# Patient Record
Sex: Female | Born: 1985 | Race: Black or African American | Hispanic: No | Marital: Married | Smoking: Never smoker
Health system: Southern US, Community
[De-identification: ages and names within clinical notes are randomized; demographics above are authoritative.]

## PROBLEM LIST (undated history)

## (undated) ENCOUNTER — Inpatient Hospital Stay (HOSPITAL_COMMUNITY): Payer: Self-pay

## (undated) DIAGNOSIS — D649 Anemia, unspecified: Secondary | ICD-10-CM

## (undated) HISTORY — PX: NO PAST SURGERIES: SHX2092

---

## 2015-11-17 ENCOUNTER — Ambulatory Visit (INDEPENDENT_AMBULATORY_CARE_PROVIDER_SITE_OTHER): Payer: BC Managed Care – PPO | Admitting: Family Medicine

## 2015-11-17 VITALS — BP 118/70 | HR 76 | Temp 98.7°F | Resp 20 | Ht 65.0 in | Wt 151.2 lb

## 2015-11-17 DIAGNOSIS — R309 Painful micturition, unspecified: Secondary | ICD-10-CM

## 2015-11-17 LAB — POCT URINALYSIS DIP (MANUAL ENTRY)
Blood, UA: NEGATIVE
Glucose, UA: NEGATIVE
Nitrite, UA: NEGATIVE
Protein Ur, POC: 30 — AB
Spec Grav, UA: 1.025
Urobilinogen, UA: 1
pH, UA: 6

## 2015-11-17 LAB — POC MICROSCOPIC URINALYSIS (UMFC)

## 2015-11-17 MED ORDER — CIPROFLOXACIN HCL 500 MG PO TABS: 500.0000 mg | ORAL_TABLET | Freq: Two times a day (BID) | ORAL | Status: DC

## 2015-11-17 MED ORDER — PHENAZOPYRIDINE HCL 200 MG PO TABS: 200.0000 mg | ORAL_TABLET | Freq: Three times a day (TID) | ORAL | Status: DC | PRN

## 2015-11-17 NOTE — Progress Notes (Signed)
By signing my name below, I, Stann Ore, attest that this documentation has been prepared under the direction and in the presence of Elvina Sidle, MD. Electronically Signed: Stann Ore, Scribe. 11/17/2015 , 9:09 PM .  Patient was seen in room 11 .   Patient ID: Deborah Huff MRN: [id number], DOB: [birth year], 30 y.o. Date of Encounter: 11/17/2015  Primary Physician: No primary care provider on file.  Chief Complaint:  Chief Complaint  Patient presents with  . Urinary Tract Infection  . Dysuria  . Dyspareunia    HPI:  Deborah Huff is a 30 y.o. female who presents to [facility] complaining of UTI symptoms with dysuria and dyspareunia over the last 4 days. She notes that having UTI's in the past during college, last one being 5 years ago. Currently, she mentions that the severity lasts over 24 hours. She's in a monogamous relationship with her husband. She denies other sexual partners. She is not trying to become pregnant at this time.   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   History reviewed. No pertinent past medical history.   Home Meds: Prior to Admission medications   Medication Sig Start Date End Date Taking? Authorizing Provider  norethindrone-ethinyl estradiol (MICROGESTIN FE 1/20) 1-20 MG-MCG tablet Take 1 tablet by mouth daily.   Yes Historical Provider, MD    Allergies: No Known Allergies  Social History   Social History  . Marital Status: Married    Spouse Name: N/A  . Number of Children: N/A  . Years of Education: N/A   Occupational History  . Not on file.   Social History Main Topics  . Smoking status: Never Smoker   . Smokeless tobacco: Not on file  . Alcohol Use: 0.6 oz/week    1 Standard drinks or equivalent per week  . Drug Use: No  . Sexual Activity: Not on file   Other Topics Concern  . Not on file     Social History Narrative  . No narrative on file     Review of Systems: Constitutional: negative for fever, chills, night sweats, weight changes, or fatigue  HEENT: negative for vision changes, hearing loss, congestion, rhinorrhea, ST, epistaxis, or sinus pressure Cardiovascular: negative for chest pain or palpitations Respiratory: negative for hemoptysis, wheezing, shortness of breath, or cough Abdominal: negative for abdominal pain, nausea, vomiting, diarrhea, or constipation Dermatological: negative for rash Neurologic: negative for headache, dizziness, or syncope GU: positive for dysuria, dyspareunia All other systems reviewed and are otherwise negative with the exception to those above and in the HPI.  Physical Exam: Blood pressure 118/70, pulse 76, temperature 98.7 F (37.1 C), temperature source Oral, resp. rate 20, height  (1.651 m), weight 151 lb 3.2 oz (68.584 kg), last menstrual period 10/27/2015, SpO2 90 %., Body mass index is 25.16 kg/(m^2). General: Well developed, well nourished, in no acute distress. Head: Normocephalic, atraumatic, eyes without discharge, sclera non-icteric, nares are without discharge. Bilateral auditory canals clear, TM's are without perforation, pearly grey and translucent with reflective cone of light bilaterally. Oral cavity moist, posterior pharynx without exudate, erythema, peritonsillar abscess, or post nasal drip.  Neck: Supple. No thyromegaly. Full ROM. No lymphadenopathy. Lungs: Clear bilaterally to auscultation without wheezes, rales, or rhonchi. Breathing is unlabored. Heart: RRR with S1 S2. No murmurs, rubs, or gallops appreciated. Abdomen: Soft, non-tender, non-distended with normoactive bowel sounds. No hepatomegaly. No rebound/guarding. No  obvious abdominal masses. Msk:  Strength and tone normal for age. Extremities/Skin: Warm and dry. No clubbing or cyanosis. No edema. No rashes or suspicious lesions. Neuro: Alert and oriented X  3. Moves all extremities spontaneously. Gait is normal. CNII-XII grossly in tact. Psych:  Responds to questions appropriately with a normal affect.   Labs: Results for orders placed or performed in visit on 11/17/15  POCT Microscopic Urinalysis ([facility])  Result Value Ref Range   WBC,UR,HPF,POC Moderate (A) None WBC/hpf   RBC,UR,HPF,POC Few (A) None RBC/hpf   Bacteria Few (A) None, Too numerous to count   Mucus Present (A) Absent   Epithelial Cells, UR Per Microscopy Moderate (A) None, Too numerous to count cells/hpf  POCT urinalysis dipstick  Result Value Ref Range   Color, UA yellow yellow   Clarity, UA cloudy (A) clear   Glucose, UA negative negative   Bilirubin, UA small (A) negative   Ketones, POC UA small (15) (A) negative   Spec Grav, UA 1.025    Blood, UA negative negative   pH, UA 6.0    Protein Ur, POC =30 (A) negative   Urobilinogen, UA 1.0    Nitrite, UA Negative Negative   Leukocytes, UA Trace (A) Negative    ASSESSMENT AND PLAN:  30 y.o. year old female with UTI This chart was scribed in my presence and reviewed by me personally.    ICD-9-CM ICD-10-CM   1. Pain with urination 788.1 R30.9 POCT Microscopic Urinalysis ([facility])     POCT urinalysis dipstick     ciprofloxacin (CIPRO) 500 MG tablet     phenazopyridine (PYRIDIUM) 200 MG tablet    Signed, Elvina Sidle, MD 11/17/2015 9:09 PM

## 2015-11-17 NOTE — Patient Instructions (Signed)

## 2015-11-22 ENCOUNTER — Other Ambulatory Visit: Payer: Self-pay | Admitting: Family Medicine

## 2015-11-22 NOTE — Telephone Encounter (Signed)
Did Cipro work?

## 2016-01-03 ENCOUNTER — Emergency Department (HOSPITAL_COMMUNITY)
Admission: EM | Admit: 2016-01-03 | Discharge: 2016-01-03 | Disposition: A | Discharge status: Home / Self Care | Payer: BC Managed Care – PPO | Source: Home / Self Care | Attending: Family Medicine | Admitting: Family Medicine

## 2016-01-03 ENCOUNTER — Encounter (HOSPITAL_COMMUNITY): Payer: Self-pay | Admitting: Emergency Medicine

## 2016-01-03 ENCOUNTER — Emergency Department (INDEPENDENT_AMBULATORY_CARE_PROVIDER_SITE_OTHER): Payer: BC Managed Care – PPO

## 2016-01-03 DIAGNOSIS — S93401A Sprain of unspecified ligament of right ankle, initial encounter: Secondary | ICD-10-CM

## 2016-01-03 MED ORDER — IBUPROFEN 400 MG PO TABS: 400.0000 mg | ORAL_TABLET | Freq: Four times a day (QID) | ORAL | Status: DC | PRN

## 2016-01-03 NOTE — ED Provider Notes (Signed)
CSN: [id number]     Arrival date & time 01/03/16  1937 History   First MD Initiated Contact with Patient 01/03/16 2051     Chief Complaint  Patient presents with  . Ankle Pain   (Consider location/radiation/quality/duration/timing/severity/associated sxs/prior Treatment) Patient is a 30 y.o. female presenting with ankle pain. The history is provided by the patient.  Ankle Pain Location:  Ankle and knee Time since incident:  1 day Injury: yes   Mechanism of injury: fall   Fall:    Fall occurred:  Recreating/playing (playground jungle gym; rolled ankle; landed with knee/ankle trapped under buttocks)   Height of fall:  Standing   Impact surface:  Ambulance personlayground equipment (jungle gym)   Point of impact:  Knees and feet   Entrapped after fall: no   Knee location:  R knee Ankle location:  R ankle Pain details:    Quality:  Throbbing and tingling   Radiates to:  Does not radiate   Severity:  Moderate   Onset quality:  Sudden   Duration:  1 day   Timing:  Constant   Progression:  Worsening Chronicity:  New Dislocation: no   Foreign body present:  No foreign bodies Prior injury to area:  No Relieved by:  Elevation and ice Worsened by:  Activity and bearing weight Ineffective treatments:  Immobilization (ankle brace) Associated symptoms: numbness (intermittent), stiffness and tingling (intermittent)   Associated symptoms: no muscle weakness   Risk factors: no frequent fractures     History reviewed. No pertinent past medical history. History reviewed. No pertinent past surgical history. Family History  Problem Relation Age of Onset  . Hypertension Mother   . Hypertension Father   . Cancer Maternal Grandmother   . Heart disease Maternal Grandmother   . Diabetes Maternal Grandmother   . Cancer Maternal Grandfather   . Heart disease Maternal Grandfather    Social History  Substance Use Topics  . Smoking status: Never Smoker   . Smokeless tobacco: None  . Alcohol Use: 0.6  oz/week    1 Standard drinks or equivalent per week   OB History    No data available     Review of Systems  Respiratory: Negative.   Cardiovascular: Negative.   Gastrointestinal: Negative.   Musculoskeletal: Positive for joint swelling, arthralgias and stiffness.  All other systems reviewed and are negative.   Allergies  Review of patient's allergies indicates no known allergies.  Home Medications   Prior to Admission medications   Medication Sig Start Date End Date Taking? Authorizing Provider  ciprofloxacin (CIPRO) 500 MG tablet Take 1 tablet (500 mg total) by mouth 2 (two) times daily. 11/17/15   Elvina SidleKurt Lauenstein, MD  norethindrone-ethinyl estradiol (MICROGESTIN FE 1/20) 1-20 MG-MCG tablet Take 1 tablet by mouth daily.    Historical Provider, MD  phenazopyridine (PYRIDIUM) 200 MG tablet Take 1 tablet (200 mg total) by mouth 3 (three) times daily as needed for pain. 11/17/15   Elvina SidleKurt Lauenstein, MD   Meds Ordered and Administered this Visit  Medications - No data to display  BP 118/55 mmHg  Pulse 77  Temp(Src) 98.4 F (36.9 C) (Oral)  SpO2 99%  LMP 12/23/2015 No data found.   Physical Exam  Constitutional: She appears well-developed. No distress.  Cardiovascular: Normal rate, regular rhythm and normal heart sounds.   No murmur heard. Pulmonary/Chest: Effort normal and breath sounds normal. No respiratory distress. She has no wheezes.  Abdominal: Soft. Bowel sounds are normal. She exhibits no distension. There is  no tenderness.  Musculoskeletal:       Right knee: Normal.       Left knee: Normal.       Right ankle: She exhibits decreased range of motion and swelling. She exhibits no ecchymosis and no laceration. Tenderness.       Left ankle: Normal.  Nursing note and vitals reviewed.   ED Course  Procedures (including critical care time)  Labs Review Labs Reviewed - No data to display  Imaging Review No results found.   Visual Acuity Review  Right Eye  Distance:   Left Eye Distance:   Bilateral Distance:    Right Eye Near:   Left Eye Near:    Bilateral Near:      Dg Ankle Complete Right  01/03/2016  CLINICAL DATA:  Missed a step and twisted ankle. Pain. Initial encounter. EXAM: RIGHT ANKLE - COMPLETE 3+ VIEW COMPARISON:  None. FINDINGS: No evidence of fracture. No subluxation or dislocation. Ankle mortise is preserved. Lateral soft tissue swelling is evident. IMPRESSION: Negative. Electronically Signed   By: Kennith Center M.D.   On: 01/03/2016 21:12      MDM  No diagnosis found. Right ankle sprain, initial encounter  Patient reassured her xray of the ankle was negative. I recommended NSAID. Ibuprofen 400 mg prn pain prescribed. Ankle brace recommended. F/U as needed.    Doreene Eland, MD 01/03/16 2135

## 2016-01-03 NOTE — Discharge Instructions (Signed)
Ankle Sprain °An ankle sprain is an injury to the strong, fibrous tissues (ligaments) that hold your ankle bones together.  °HOME CARE  °· Put ice on your ankle for 1-2 days or as told by your doctor. °¨ Put ice in a plastic bag. °¨ Place a towel between your skin and the bag. °¨ Leave the ice on for 15-20 minutes at a time, every 2 hours while you are awake. °· Only take medicine as told by your doctor. °· Raise (elevate) your injured ankle above the level of your heart as much as possible for 2-3 days. °· Use crutches if your doctor tells you to. Slowly put your own weight on the affected ankle. Use the crutches until you can walk without pain. °· If you have a plaster splint: °¨ Do not rest it on anything harder than a pillow for 24 hours. °¨ Do not put weight on it. °¨ Do not get it wet. °¨ Take it off to shower or bathe. °· If given, use an elastic wrap or support stocking for support. Take the wrap off if your toes lose feeling (numb), tingle, or turn cold or blue. °· If you have an air splint: °¨ Add or let out air to make it comfortable. °¨ Take it off at night and to shower and bathe. °¨ Wiggle your toes and move your ankle up and down often while you are wearing it. °GET HELP IF: °· You have rapidly increasing bruising or puffiness (swelling). °· Your toes feel very cold. °· You lose feeling in your foot. °· Your medicine does not help your pain. °GET HELP RIGHT AWAY IF:  °· Your toes lose feeling (numb) or turn blue. °· You have severe pain that is increasing. °MAKE SURE YOU:  °· Understand these instructions. °· Will watch your condition. °· Will get help right away if you are not doing well or get worse. °  °This information is not intended to replace advice given to you by your health care provider. Make sure you discuss any questions you have with your health care provider. °  °Document Released: 03/28/2008 Document Revised: 10/31/2014 Document Reviewed: 04/23/2012 °Elsevier Interactive Patient  Education ©2016 Elsevier Inc. ° °

## 2016-01-03 NOTE — ED Notes (Signed)
Pt here with right ankle lateral swelling and pain after [place] step [place] the park yesterday Ace wrap, compression applied, Ibuprofen

## 2016-04-06 ENCOUNTER — Ambulatory Visit (INDEPENDENT_AMBULATORY_CARE_PROVIDER_SITE_OTHER): Payer: BC Managed Care – PPO | Admitting: Family Medicine

## 2016-04-06 VITALS — BP 122/78 | HR 64 | Temp 97.9°F | Resp 16 | Wt 163.0 lb

## 2016-04-06 DIAGNOSIS — G609 Hereditary and idiopathic neuropathy, unspecified: Secondary | ICD-10-CM

## 2016-04-06 LAB — TSH: TSH: 0.41 mIU/L

## 2016-04-06 LAB — VITAMIN B12: Vitamin B-12: 289 pg/mL (ref 200–1100)

## 2016-04-06 NOTE — Progress Notes (Signed)
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 When she was there, she walked on hot sand initially handled some hot dishes. She developed numbness in her feet and in the tips of her fingers on her ulnar 3 fingers.  The numbness on her feet has resolved and the numbness in her fingers is getting better. She is concerned that she might have diabetes because this runs in family.  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   Objective: BP 122/78 mmHg  Pulse 64  Temp(Src) 97.9 F (36.6 C) (Oral)  Resp 16  Wt 163 lb (73.936 kg)  SpO2 96%  LMP 04/05/2016 General appearance: Healthy-appearing young adult woman in no distress Neurological exam: Normal strength in 4 extremities, normal reflexes in biceps, triceps, ankles, and knees. Cranial nerves III through XII: Intact Motor exam shows normal gait and normal strength: Intact. Tinel's and Phalen's signs are negative Skin exam: No rash Pulses, normal Neck: Normal range of motion   Assessment: Gradually improving numbness in fingertips on the ulnar size of her fingers, resolving numbness on her feet. I do not believe that these represent a significant demyelinating process.  Plan: Hereditary and idiopathic peripheral neuropathy - Plan: BASIC METABOLIC PANEL WITH GFR, TSH, Vitamin B12  . Elvina SidleKurt Eziah Negro M.D.

## 2016-04-06 NOTE — Patient Instructions (Addendum)
I believe that you do have a mild neuropathy which should resolve over the next week or so. Please call if the symptoms are worsening or if they fail to resolve in a week. One simple strategies to speed things up would be to take vitamin B6 (paradoxical) 50 mg 3 times a day for week.  Environmental link to [email or web address]    IF you received an x-ray today, you will [facility]. Please contact [facility] at [phone] with questions or concerns regarding your invoice.   IF you received labwork today, you will receive an invoice from [facility]. Please contact [facility] at [phone] with questions or concerns regarding your invoice.   Our billing staff will not be able to assist you with questions regarding bills from these companies.  You will be contacted with the lab results as soon as they are available. The fastest way to get your results is to activate your My Chart account. Instructions are located on the last page of this paperwork. If you have not heard from us regarding the results in 2 weeks, please contact this office.

## 2016-04-07 ENCOUNTER — Telehealth: Payer: Self-pay | Admitting: Emergency Medicine

## 2016-04-07 LAB — BASIC METABOLIC PANEL WITH GFR
BUN: 12 mg/dL (ref 7–25)
CO2: 19 mmol/L — ABNORMAL LOW (ref 20–31)
Calcium: 9.3 mg/dL (ref 8.6–10.2)
Chloride: 105 mmol/L (ref 98–110)
Creat: 0.88 mg/dL (ref 0.50–1.10)
GFR, Est African American: >89 mL/min (ref >=60)
GFR, Est Non African American: >89 mL/min (ref >=60)
Glucose, Bld: 82 mg/dL (ref 65–99)
Potassium: 4.2 mmol/L (ref 3.5–5.3)
Sodium: 134 mmol/L — ABNORMAL LOW (ref 135–146)

## 2016-04-07 NOTE — Telephone Encounter (Signed)
Pt given normal blood results 

## 2016-04-07 NOTE — Telephone Encounter (Signed)
-----   Message from Elvina SidleKurt Lauenstein, MD sent at 04/07/2016  8:35 AM EDT ----- Please inform patient of normal result

## 2016-06-21 IMAGING — DX DG ANKLE COMPLETE 3+V*R*
3 series · 3 of 3 positions shown · non-contrast
Comparison: None.

CLINICAL DATA: Missed a step and twisted ankle. Pain. Initial
encounter.

EXAM:
RIGHT ANKLE - COMPLETE 3+ VIEW

[ankle ap]
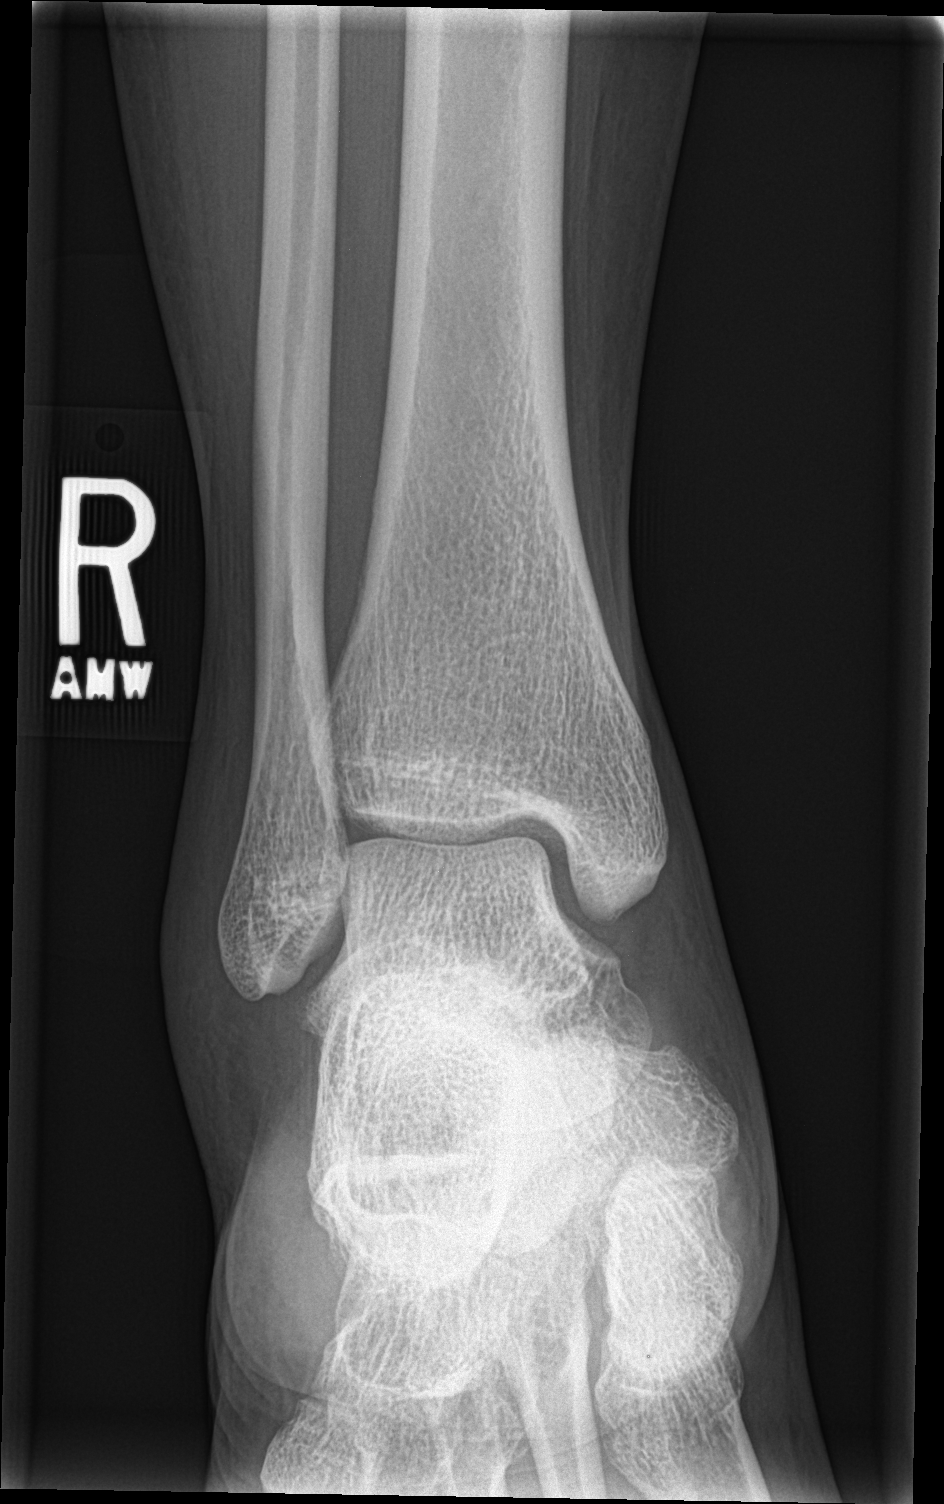

[ankle obl]
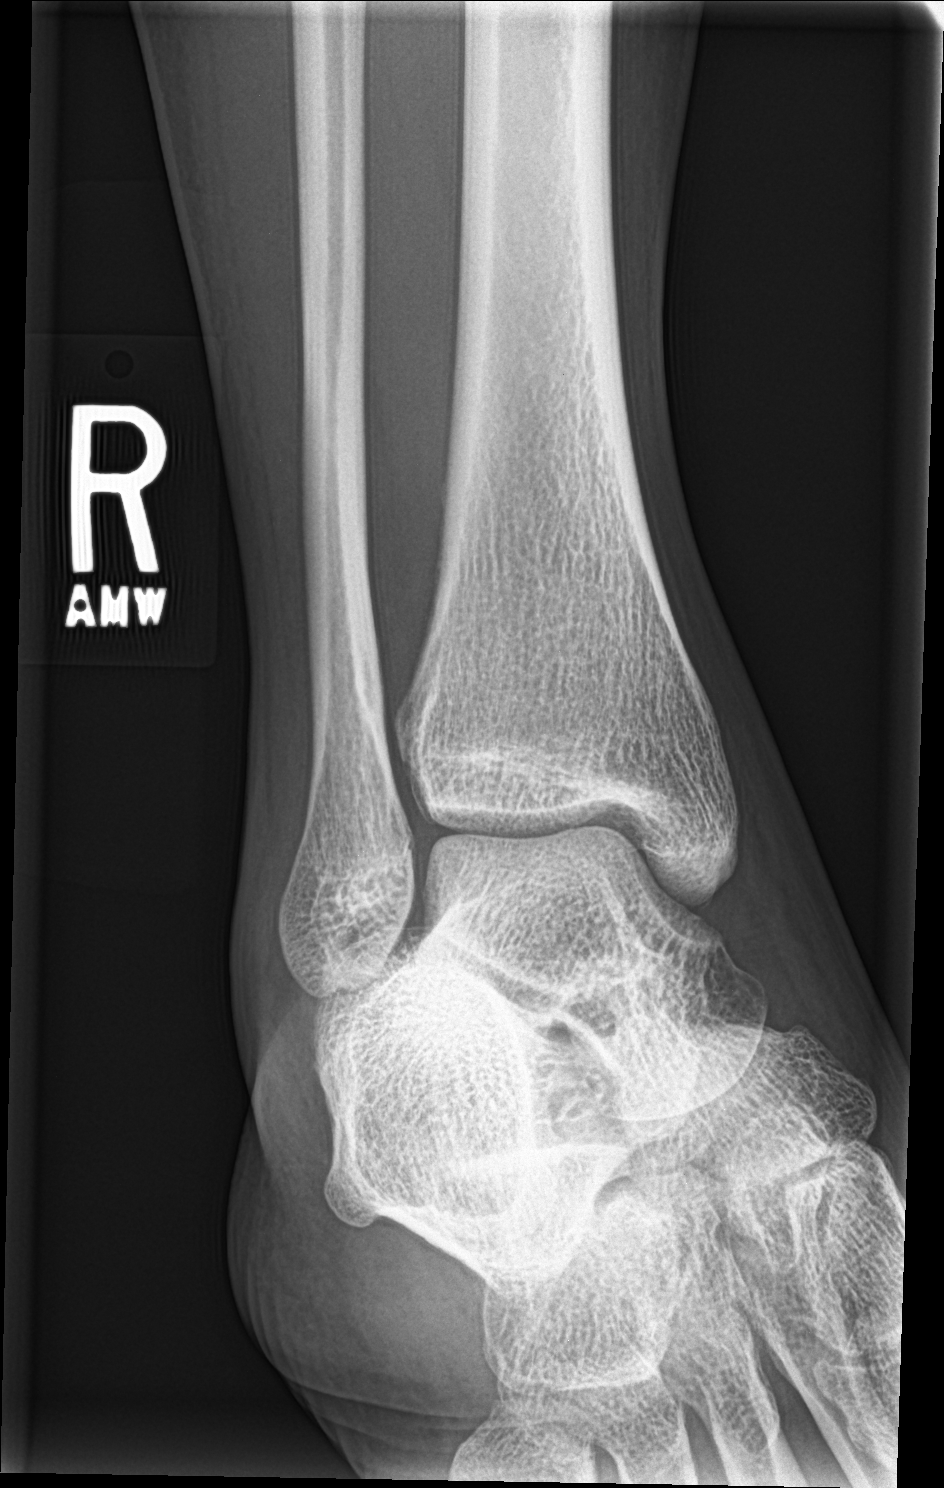

[ankle lat]
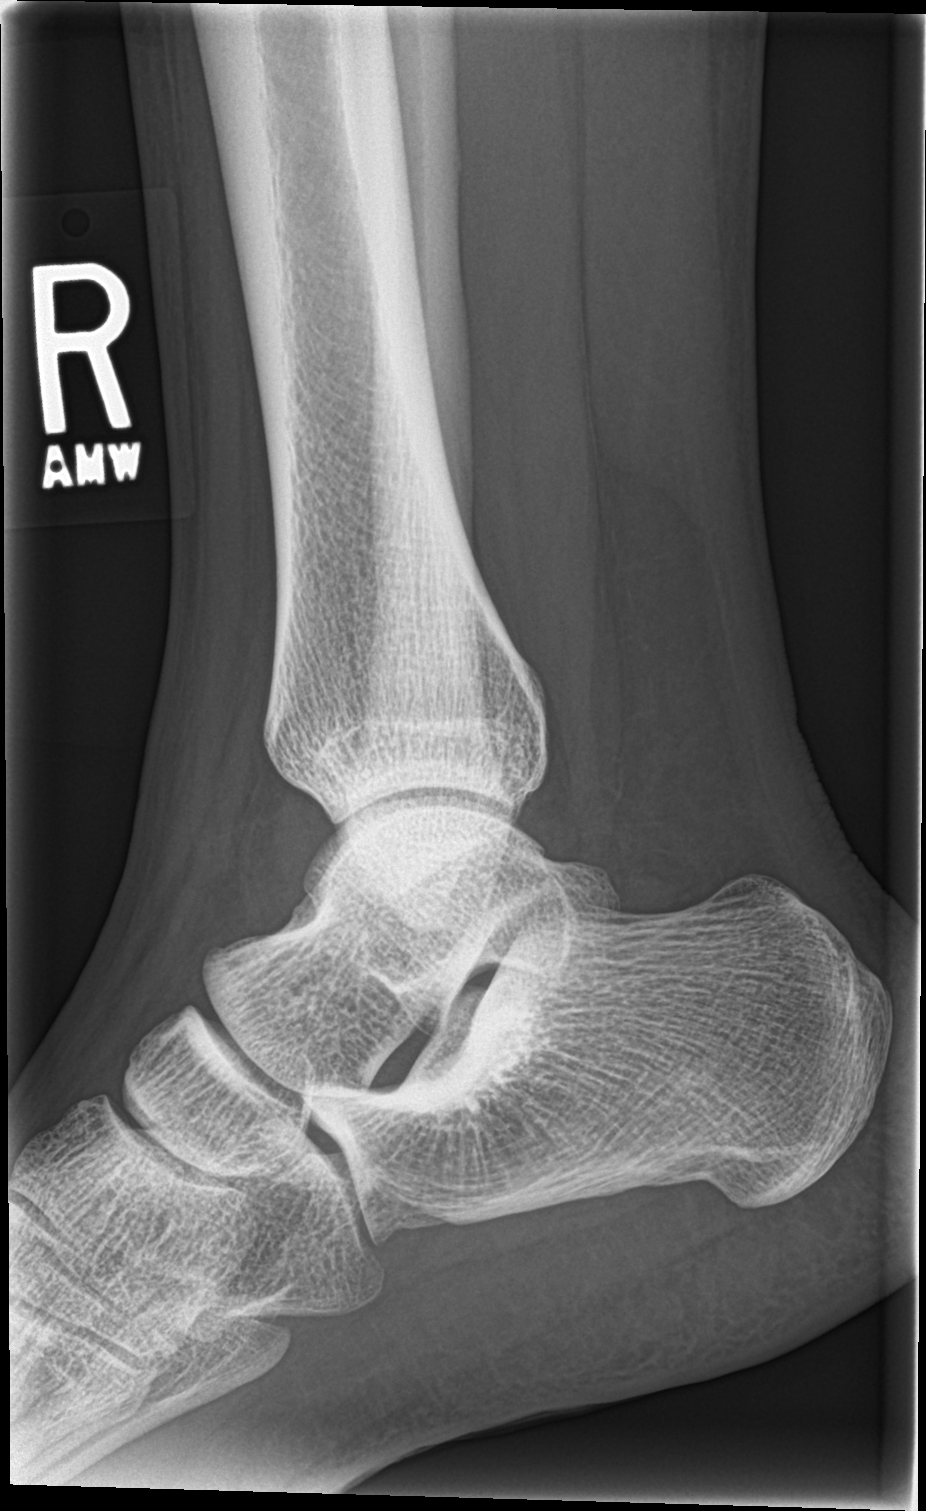

[3 of 3 positions shown; findings below may reference images not displayed]

FINDINGS: No evidence of fracture. No subluxation or dislocation. Ankle
mortise is preserved. Lateral soft tissue swelling is evident.
IMPRESSION: Negative.

## 2017-05-04 LAB — OB RESULTS CONSOLE GC/CHLAMYDIA
Chlamydia: NEGATIVE
Gonorrhea: NEGATIVE

## 2017-05-09 LAB — OB RESULTS CONSOLE HEPATITIS B SURFACE ANTIGEN: Hepatitis B Surface Ag: NEGATIVE

## 2017-05-09 LAB — OB RESULTS CONSOLE HIV ANTIBODY (ROUTINE TESTING): HIV: NONREACTIVE

## 2017-05-09 LAB — OB RESULTS CONSOLE ABO/RH: RH Type: POSITIVE

## 2017-05-09 LAB — OB RESULTS CONSOLE ANTIBODY SCREEN: Antibody Screen: NEGATIVE

## 2017-05-09 LAB — OB RESULTS CONSOLE RUBELLA ANTIBODY, IGM: Rubella: IMMUNE

## 2017-05-09 LAB — OB RESULTS CONSOLE RPR: RPR: NONREACTIVE

## 2017-08-31 ENCOUNTER — Other Ambulatory Visit: Payer: Self-pay

## 2017-08-31 ENCOUNTER — Encounter (HOSPITAL_COMMUNITY): Payer: Self-pay

## 2017-08-31 ENCOUNTER — Inpatient Hospital Stay (HOSPITAL_COMMUNITY)
Admission: AD | Admit: 2017-08-31 | Discharge: 2017-08-31 | Disposition: A | Discharge status: Home / Self Care | Payer: BC Managed Care – PPO | Source: Ambulatory Visit | Attending: Obstetrics and Gynecology | Admitting: Obstetrics and Gynecology

## 2017-08-31 DIAGNOSIS — O26892 Other specified pregnancy related conditions, second trimester: Secondary | ICD-10-CM | POA: Diagnosis not present

## 2017-08-31 DIAGNOSIS — O99612 Diseases of the digestive system complicating pregnancy, second trimester: Secondary | ICD-10-CM

## 2017-08-31 DIAGNOSIS — O99619 Diseases of the digestive system complicating pregnancy, unspecified trimester: Secondary | ICD-10-CM

## 2017-08-31 DIAGNOSIS — J988 Other specified respiratory disorders: Secondary | ICD-10-CM

## 2017-08-31 DIAGNOSIS — Z3A26 26 weeks gestation of pregnancy: Secondary | ICD-10-CM | POA: Diagnosis not present

## 2017-08-31 DIAGNOSIS — J309 Allergic rhinitis, unspecified: Secondary | ICD-10-CM | POA: Diagnosis not present

## 2017-08-31 DIAGNOSIS — K219 Gastro-esophageal reflux disease without esophagitis: Secondary | ICD-10-CM

## 2017-08-31 DIAGNOSIS — R0603 Acute respiratory distress: Secondary | ICD-10-CM | POA: Diagnosis present

## 2017-08-31 DIAGNOSIS — R079 Chest pain, unspecified: Secondary | ICD-10-CM | POA: Insufficient documentation

## 2017-08-31 DIAGNOSIS — J398 Other specified diseases of upper respiratory tract: Secondary | ICD-10-CM

## 2017-08-31 HISTORY — DX: Anemia, unspecified: D64.9

## 2017-08-31 LAB — URINALYSIS, ROUTINE W REFLEX MICROSCOPIC
Bacteria, UA: NONE SEEN
Bilirubin Urine: NEGATIVE
Glucose, UA: NEGATIVE mg/dL
Hgb urine dipstick: NEGATIVE
Ketones, ur: NEGATIVE mg/dL
Nitrite: NEGATIVE
Protein, ur: NEGATIVE mg/dL
Specific Gravity, Urine: 1.002 — ABNORMAL LOW (ref 1.005–1.030)
pH: 7 (ref 5.0–8.0)

## 2017-08-31 MED ORDER — CETIRIZINE HCL 10 MG PO TABS: 10.0000 mg | ORAL_TABLET | Freq: Every day | ORAL | 1 refills | Status: AC

## 2017-08-31 MED ORDER — RANITIDINE HCL 150 MG PO TABS: 150.0000 mg | ORAL_TABLET | Freq: Two times a day (BID) | ORAL | Status: DC

## 2017-08-31 MED ORDER — GI COCKTAIL ~~LOC~~
30.0000 mL | Freq: Once | ORAL | Status: AC
  Administered 2017-08-31: 30 mL via ORAL
  Filled 2017-08-31: qty 30

## 2017-08-31 NOTE — Discharge Instructions (Signed)
Heartburn During Pregnancy Heartburn is pain or discomfort in the throat or chest. It may cause a burning feeling. It happens when stomach acid moves up into the tube that carries food from your mouth to your stomach (esophagus). Heartburn is common during pregnancy. It usually goes away or gets better after giving birth. Follow these instructions at home: Eating and drinking  Do not drink alcohol while you are pregnant.  Figure out which foods and beverages make you feel worse, and avoid them.  Beverages that you may want to avoid include: ? Coffee and tea (with or without caffeine). ? Energy drinks and sports drinks. ? Bubbly (carbonated) drinks or sodas. ? Citrus fruit juices.  Foods that you may want to avoid include: ? Chocolate and cocoa. ? Peppermint and mint flavorings. ? Garlic, onions, and horseradish. ? Spicy and acidic foods. These include peppers, chili powder, curry powder, vinegar, hot sauces, and barbecue sauce. ? Citrus fruits, such as oranges, lemons, and limes. ? Tomato-based foods, such as red sauce, chili, and salsa. ? Fried and fatty foods, such as donuts, french fries, potato chips, and high-fat dressings. ? High-fat meats, such as hot dogs, cold cuts, sausage, ham, and bacon. ? High-fat dairy items, such as whole milk, butter, and cheese.  Eat small meals often, instead of large meals.  Avoid drinking a lot of liquid with your meals.  Avoid eating meals during the 2-3 hours before you go to bed.  Avoid lying down right after you eat.  Do not exercise right after you eat. Medicines  Take over-the-counter and prescription medicines only as told by your doctor.  Do not take aspirin, ibuprofen, or other NSAIDs unless your doctor tells you to do that.  Your doctor may tell you to avoid medicines that have sodium bicarbonate in them. General instructions  If told, raise the head of your bed about 6 inches (15 cm). You can do this by putting blocks under  the legs. Sleeping with more pillows does not help with heartburn.  Do not use any products that contain nicotine or tobacco, such as cigarettes and e-cigarettes. If you need help quitting, ask your doctor.  Wear loose-fitting clothing.  Try to lower your stress, such as with yoga or meditation. If you need help, ask your doctor.  Stay at a healthy weight. If you are overweight, work with your doctor to safely lose weight.  Keep all follow-up visits as told by your doctor. This is important. Contact a doctor if:  You get new symptoms.  Your symptoms do not get better with treatment.  You have weight loss and you do not know why.  You have trouble swallowing.  You make loud sounds when you breathe (wheeze).  You have a cough that does not go away.  You have heartburn often for more than 2 weeks.  You feel sick to your stomach (nauseous), and this does not get better with treatment.  You are throwing up (vomiting), and this does not get better with treatment.  You have pain in your belly (abdomen). Get help right away if:  You have very bad chest pain that spreads to your arm, neck, or jaw.  You feel sweaty, dizzy, or light-headed.  You have trouble breathing.  You have pain when swallowing.  You throw up and your throw-up looks like blood or coffee grounds.  Your poop (stool) is bloody or black. This information is not intended to replace advice given to you by your health care provider.   Make sure you discuss any questions you have with your health care provider. Document Released: 11/12/2010 Document Revised: 06/27/2016 Document Reviewed: 06/27/2016 Elsevier Interactive Patient Education  2017 Elsevier Inc.  

## 2017-08-31 NOTE — MAU Note (Signed)
About 1 am, woke up, felt like couldn't breathe, tried to clear throat, blow nose, started having chest and back pain and then difficulty swallowing. Husband rub on her chest and that went pain went away. Only able to cough up clear spit and mucus but no color. Now once arriving to MAU, feels like breathing is better.  No vag bleeding. No leaking, baby moving well.

## 2017-08-31 NOTE — MAU Provider Note (Signed)
History  CSN: [id number] Arrival date and time: 08/31/17 0155  First Provider Initiated Contact with Patient 08/31/17 0314      Chief Complaint  Patient presents with  . Respiratory Distress  . Dysphagia    HPI: Deborah Huff is a 31 y.o. G1P0 with IUP at 3346w6d who presents to maternity admissions reporting that she woke up around 1 am choking and difficulty breathing. She report that she had to clear her throat several for a while but still felt like it was hard to breathe. She also reports having to blow her nose and felt that one her nostrils were congested. Reports it took several minutes before she felt better. As she was trying to cough, she felt some chest and back pain  (points to mid and right chest). This improved with her husband rubbing on her chest and back, but still feels some mild burning. Denies SOB or difficulty breathing now, but needing to constantly clear her throat. Endorses that she has had some congestion, mostly at night. Denies cough, body aches, fever or chills. Endorses history of allergic rhinitis, but not currently taking anything for this. She also reports hx of reflux for which she takes ranitidine qAM. She last ate about 1.5-2 hours before going to bed.   Denies contractions, leakage of fluid or vaginal bleeding. Reports good fetal movement. Also denies any abnormal vaginal discharge, malaise, dysuria, hematuria, urinary frequency, nausea, vomiting, diarrhea, RUQ/epigastric pain, dizziness/lighreadhess, or headache.   [facility]   OB History  Gravida Para Term Preterm AB Living  1            SAB TAB Ectopic Multiple Live Births               # Outcome Date GA Lbr Len/2nd Weight Sex Delivery Anes PTL Lv  1 Current              Past Medical History:  Diagnosis Date  . Anemia    Past Surgical History:  Procedure Laterality Date  . NO PAST SURGERIES     Family History  Problem Relation Age of Onset  . Hypertension Mother    . Hypertension Father   . Cancer Maternal Grandmother   . Heart disease Maternal Grandmother   . Diabetes Maternal Grandmother   . Cancer Maternal Grandfather   . Heart disease Maternal Grandfather    Social History   Socioeconomic History  . Marital status: Married    Spouse name: Not on file  . Number of children: Not on file  . Years of education: Not on file  . Highest education level: Not on file  Social Needs  . Financial resource strain: Not on file  . Food insecurity - worry: Not on file  . Food insecurity - inability: Not on file  . Transportation needs - medical: Not on file  . Transportation needs - non-medical: Not on file  Occupational History  . Not on file  Tobacco Use  . Smoking status: Never Smoker  . Smokeless tobacco: Never Used  Substance and Sexual Activity  . Alcohol use: Yes    Alcohol/week: 0.6 oz    Types: 1 Standard drinks or equivalent per week  . Drug use: No  . Sexual activity: Not on file  Other Topics Concern  . Not on file  Social History Narrative  . Not on file   No Known Allergies  Medications Prior to Admission  Medication Sig Dispense Refill Last Dose  .  Prenatal Vit-Fe Fumarate-FA (PRENATAL MULTIVITAMIN) TABS tablet Take 1 tablet daily at 12 noon by mouth.   08/30/2017 at Unknown time  . ranitidine (ZANTAC) 150 MG tablet Take 150 mg daily by mouth.   08/30/2017 at Unknown time  . ibuprofen (ADVIL,MOTRIN) 400 MG tablet Take 1 tablet (400 mg total) by mouth every 6 (six) hours as needed. 30 tablet 0   . norethindrone-ethinyl estradiol (MICROGESTIN FE 1/20) 1-20 MG-MCG tablet Take 1 tablet by mouth daily.   Taking    I have reviewed patient's Past Medical Hx, Surgical Hx, Family Hx, Social Hx, medications and allergies.   Review of Systems: Negative except for what is mentioned in HPI.  Physical Exam   Blood pressure 109/61, pulse 79, temperature 98.1 F (36.7 C), temperature source Oral, resp. rate 20, height 5\' 2"  (1.575 m),  weight 192 lb (87.1 kg), SpO2 100 %.  Constitutional: Well-developed, well-nourished female in no acute distress. Repeatedly clearing her throat HENT: [place]/AT. Edematous nasal mucosa bilaterally. Normal oropharyngeal mucosa.  Neck: no cervical adenopathy  Eyes: normal conjunctivae, no scleral icterus Cardiovascular: normal rate, regular rhythm, no murmurs, rubs or gallops Respiratory: normal respiratory effort; lungs CTAB with no wheezes, rales or ronchi GI: Abd soft, non-tender, gravid appropriate for gestational age.   MSK: No chest wall tenderness. BLE w/o edema, swelling or tenderness. DP pulses 2+ bilaterally Neurologic: Alert and oriented x 4. Psych: Normal mood and affect Skin: warm and dry   FHT:  Baseline 145 , moderate variability, accelerations present, no decelerations Toco: no contractions  MAU Course/MDM:   Nursing notes and VS reviewed. Patient seen and examined, as noted above.   EKG wnl GI cocktail given with patient reporting resolution of burning on her chest.  Assessment and Plan  Assessment: 31 y.o. G1P0 with IUP at 39105w6d who presents with chocking cessation associated with some difficulty catching her breath and chest pain/burning. On exam, she has signs of rhinitis and she has hx of allergic rhinitis. She also has hx of GER, and symptoms chest symptoms resolved with GI cocktail. Cardiopulmonary exam is reassuring cardiac/pulmonary etiologies unlikely. Discussed exam finding and ddx with patient, and recommended treating borh allergic rhinitis and GERD. She is already taking H2B in am. Advised to add bedtime dose.   Plan: --Discharge home in stable condition.  --Ranitidine BID --Zyrtec qhs --Discussed return precautions at length  Pt stable at time of discharge and comfortable with plan.  Raynelle FanningJulie P. Kylia Grajales, MD OB Fellow  08/31/2017 3:15 AM  Discussed with Dr. Claiborne Billingsallahan.

## 2017-09-06 ENCOUNTER — Inpatient Hospital Stay (HOSPITAL_COMMUNITY)
Admission: AD | Admit: 2017-09-06 | Discharge: 2017-09-07 | Disposition: A | Discharge status: Home / Self Care | Payer: BC Managed Care – PPO | Source: Ambulatory Visit | Attending: Obstetrics and Gynecology | Admitting: Obstetrics and Gynecology

## 2017-09-06 ENCOUNTER — Encounter (HOSPITAL_COMMUNITY): Payer: Self-pay | Admitting: *Deleted

## 2017-09-06 DIAGNOSIS — J069 Acute upper respiratory infection, unspecified: Secondary | ICD-10-CM

## 2017-09-06 DIAGNOSIS — R6889 Other general symptoms and signs: Secondary | ICD-10-CM

## 2017-09-06 DIAGNOSIS — O99512 Diseases of the respiratory system complicating pregnancy, second trimester: Secondary | ICD-10-CM | POA: Diagnosis not present

## 2017-09-06 DIAGNOSIS — J029 Acute pharyngitis, unspecified: Secondary | ICD-10-CM | POA: Diagnosis present

## 2017-09-06 DIAGNOSIS — Z3A27 27 weeks gestation of pregnancy: Secondary | ICD-10-CM | POA: Insufficient documentation

## 2017-09-06 DIAGNOSIS — O9989 Other specified diseases and conditions complicating pregnancy, childbirth and the puerperium: Secondary | ICD-10-CM | POA: Diagnosis not present

## 2017-09-06 LAB — URINALYSIS, ROUTINE W REFLEX MICROSCOPIC
Bilirubin Urine: NEGATIVE
Glucose, UA: NEGATIVE mg/dL
Ketones, ur: 5 mg/dL — AB
Nitrite: NEGATIVE
Protein, ur: NEGATIVE mg/dL
Specific Gravity, Urine: 1.012 (ref 1.005–1.030)
pH: 6 (ref 5.0–8.0)

## 2017-09-06 LAB — INFLUENZA PANEL BY PCR (TYPE A & B)
Influenza A By PCR: NEGATIVE
Influenza B By PCR: NEGATIVE

## 2017-09-06 MED ORDER — PSEUDOEPHEDRINE HCL 30 MG PO TABS
60.0000 mg | ORAL_TABLET | Freq: Once | ORAL | Status: AC
  Administered 2017-09-06: 60 mg via ORAL
  Filled 2017-09-06: qty 2

## 2017-09-06 MED ORDER — ACETAMINOPHEN 500 MG PO TABS
1000.0000 mg | ORAL_TABLET | Freq: Once | ORAL | Status: AC
  Administered 2017-09-06: 1000 mg via ORAL
  Filled 2017-09-06: qty 2

## 2017-09-06 MED ORDER — ONDANSETRON 8 MG PO TBDP
8.0000 mg | ORAL_TABLET | Freq: Once | ORAL | Status: AC
  Administered 2017-09-06: 8 mg via ORAL
  Filled 2017-09-06: qty 1

## 2017-09-06 NOTE — MAU Note (Addendum)
Pt presents to MAU c/o low grade fever that started 09/05/17 it then subsided yesterday evening and pt proceeded to work today. Pt reports by the end of her work day she had a fever again. Pt reports cough that started on Monday and that has worsened last night. Pt reports a sore throat and states she can "feel the air in her chest" when she breathes in. Pt denies the pain in her chest radiating and states its from the cold. Pt states blood and mucous in her nose when she blows her nose. Pt reports vaginal pressure that she had on the ride to MAU that has subsided now.

## 2017-09-06 NOTE — MAU Provider Note (Signed)
History     CSN: [id number]  Arrival date and time: 09/06/17 2237   First Provider Initiated Contact with Patient 09/06/17 2336      Chief Complaint  Patient presents with  . URI   Deborah Huff is a 31 y.o. G1P0 at 6948w5d who presents today with congestion, sore throat and fever. She was seen here on 08/31/17, and was told she had allergies and acid reflux. She states that she has continued to feel worse over the last week. She now has fever, body aches, congestion and sore throat. She denies any VB or LOF. She reports normal fetal movement.    URI   This is a new problem. The current episode started in the past 7 days. The problem has been gradually worsening. The maximum temperature recorded prior to her arrival was 100.4 - 100.9 F. The fever has been present for less than 1 day. Associated symptoms include coughing, nausea, sinus pain and a sore throat. Pertinent negatives include no dysuria or vomiting. She has tried decongestant and acetaminophen for the symptoms. The treatment provided mild relief.    Past Medical History:  Diagnosis Date  . Anemia     Past Surgical History:  Procedure Laterality Date  . NO PAST SURGERIES      Family History  Problem Relation Age of Onset  . Hypertension Mother   . Hypertension Father   . Cancer Maternal Grandmother   . Heart disease Maternal Grandmother   . Diabetes Maternal Grandmother   . Cancer Maternal Grandfather   . Heart disease Maternal Grandfather     Social History   Tobacco Use  . Smoking status: Never Smoker  . Smokeless tobacco: Never Used  Substance Use Topics  . Alcohol use: No    Alcohol/week: 0.6 oz    Types: 1 Standard drinks or equivalent per week    Frequency: Never  . Drug use: No    Allergies: No Known Allergies  Medications Prior to Admission  Medication Sig Dispense Refill Last Dose  . acetaminophen (TYLENOL) 500 MG tablet Take 500 mg every 6 (six) hours as needed by mouth.   09/05/2017 at  Unknown time  . cetirizine (ZYRTEC ALLERGY) 10 MG tablet Take 1 tablet (10 mg total) at bedtime by mouth. 30 tablet 1 09/05/2017 at Unknown time  . ferrous sulfate 325 (65 FE) MG tablet Take 325 mg daily with breakfast by mouth.   09/06/2017 at Unknown time  . pseudoephedrine (SUDAFED) 30 MG tablet Take 30 mg every 4 (four) hours as needed by mouth for congestion.   09/06/2017 at Unknown time  . ranitidine (ZANTAC) 150 MG tablet Take 1 tablet (150 mg total) 2 (two) times daily by mouth.   09/06/2017 at Unknown time  . vitamin B-12 (CYANOCOBALAMIN) 1000 MCG tablet Take 2,000 mcg daily by mouth.   09/06/2017 at Unknown time  . ibuprofen (ADVIL,MOTRIN) 400 MG tablet Take 1 tablet (400 mg total) by mouth every 6 (six) hours as needed. 30 tablet 0 Unknown at Unknown time  . norethindrone-ethinyl estradiol (MICROGESTIN FE 1/20) 1-20 MG-MCG tablet Take 1 tablet by mouth daily.   Taking  . Prenatal Vit-Fe Fumarate-FA (PRENATAL MULTIVITAMIN) TABS tablet Take 1 tablet daily at 12 noon by mouth.   08/30/2017 at Unknown time    Review of Systems  Constitutional: Positive for chills and fever.  HENT: Positive for sinus pain and sore throat.   Respiratory: Positive for cough.   Gastrointestinal: Positive for nausea. Negative for vomiting.  Genitourinary: Negative for dysuria, pelvic pain, vaginal bleeding and vaginal discharge.   Physical Exam   Blood pressure 123/68, pulse (!) 106, temperature (!) 100.4 F (38 C), temperature source Oral, height 5' 5.5" (1.664 m), weight 192 lb (87.1 kg).  Physical Exam  Nursing note and vitals reviewed. Constitutional: She is oriented to person, place, and time. She appears well-developed and well-nourished. No distress.  HENT:  Head: Normocephalic.  Cardiovascular: Normal rate.  Respiratory: Effort normal and breath sounds normal. No respiratory distress. She has no wheezes.  GI: Soft. There is no tenderness. There is no rebound.  Neurological: She is alert and  oriented to person, place, and time.  Skin: Skin is warm and dry.  Psychiatric: She has a normal mood and affect.     Results for orders placed or performed during the hospital encounter of 09/06/17 (from the past 24 hour(s))  Urinalysis, Routine w reflex microscopic     Status: Abnormal   Collection Time: 09/06/17 10:50 PM  Result Value Ref Range   Color, Urine YELLOW YELLOW   APPearance CLEAR CLEAR   Specific Gravity, Urine 1.012 1.005 - 1.030   pH 6.0 5.0 - 8.0   Glucose, UA NEGATIVE NEGATIVE mg/dL   Hgb urine dipstick SMALL (A) NEGATIVE   Bilirubin Urine NEGATIVE NEGATIVE   Ketones, ur 5 (A) NEGATIVE mg/dL   Protein, ur NEGATIVE NEGATIVE mg/dL   Nitrite NEGATIVE NEGATIVE   Leukocytes, UA LARGE (A) NEGATIVE   RBC / HPF 0-5 0 - 5 RBC/hpf   WBC, UA 6-30 0 - 5 WBC/hpf   Bacteria, UA RARE (A) NONE SEEN   Squamous Epithelial / LPF 0-5 (A) NONE SEEN   Mucus PRESENT   Influenza panel by PCR (type A & B)     Status: None   Collection Time: 09/06/17 11:20 PM  Result Value Ref Range   Influenza A By PCR NEGATIVE NEGATIVE   Influenza B By PCR NEGATIVE NEGATIVE   FHT: 145, moderate with 15x15 accels, no decels Toco: no UCs.  MAU Course  Procedures  MDM 0017 DW Dr. Claiborne Billingsallahan, ok for DC home with tamiflu   Assessment and Plan   1. Viral URI   2. Flu-like symptoms    DC home Comfort measures reviewed  3rd Trimester precautions  PTL precautions  Fetal kick counts RX: tamiflu BID x 5 days, zofran PRN #20  Return to MAU as needed FU with OB as planned  Follow-up Information    Philip Aspenallahan, [place], DO Follow up.   Specialty:  Obstetrics and Gynecology Contact information: [address] [place] [address] [phone]            Thressa ShellerHeather Hogan 09/06/2017, 11:37 PM

## 2017-09-07 DIAGNOSIS — J069 Acute upper respiratory infection, unspecified: Secondary | ICD-10-CM

## 2017-09-07 DIAGNOSIS — O9989 Other specified diseases and conditions complicating pregnancy, childbirth and the puerperium: Secondary | ICD-10-CM

## 2017-09-07 LAB — RAPID STREP SCREEN (MED CTR MEBANE ONLY): Streptococcus, Group A Screen (Direct): NEGATIVE

## 2017-09-07 MED ORDER — OSELTAMIVIR PHOSPHATE 75 MG PO CAPS: 75.0000 mg | ORAL_CAPSULE | Freq: Two times a day (BID) | ORAL | 0 refills | Status: DC

## 2017-09-07 MED ORDER — ONDANSETRON 8 MG PO TBDP: 8.0000 mg | ORAL_TABLET | Freq: Three times a day (TID) | ORAL | 0 refills | Status: DC | PRN

## 2017-09-07 NOTE — Discharge Instructions (Signed)

## 2017-09-09 LAB — CULTURE, GROUP A STREP (THRC)

## 2017-09-17 ENCOUNTER — Inpatient Hospital Stay (HOSPITAL_COMMUNITY): Payer: BC Managed Care – PPO

## 2017-09-17 ENCOUNTER — Inpatient Hospital Stay (HOSPITAL_COMMUNITY)
Admission: AD | Admit: 2017-09-17 | Discharge: 2017-09-17 | Disposition: A | Discharge status: Home / Self Care | Payer: BC Managed Care – PPO | Source: Ambulatory Visit | Attending: Obstetrics and Gynecology | Admitting: Obstetrics and Gynecology

## 2017-09-17 DIAGNOSIS — O26893 Other specified pregnancy related conditions, third trimester: Secondary | ICD-10-CM | POA: Insufficient documentation

## 2017-09-17 DIAGNOSIS — R059 Cough, unspecified: Secondary | ICD-10-CM

## 2017-09-17 DIAGNOSIS — Z3A29 29 weeks gestation of pregnancy: Secondary | ICD-10-CM | POA: Diagnosis not present

## 2017-09-17 DIAGNOSIS — Z809 Family history of malignant neoplasm, unspecified: Secondary | ICD-10-CM | POA: Insufficient documentation

## 2017-09-17 DIAGNOSIS — R05 Cough: Secondary | ICD-10-CM | POA: Insufficient documentation

## 2017-09-17 DIAGNOSIS — R0781 Pleurodynia: Secondary | ICD-10-CM | POA: Diagnosis not present

## 2017-09-17 DIAGNOSIS — Z8249 Family history of ischemic heart disease and other diseases of the circulatory system: Secondary | ICD-10-CM | POA: Insufficient documentation

## 2017-09-17 DIAGNOSIS — R071 Chest pain on breathing: Secondary | ICD-10-CM

## 2017-09-17 DIAGNOSIS — Z833 Family history of diabetes mellitus: Secondary | ICD-10-CM | POA: Insufficient documentation

## 2017-09-17 LAB — URINALYSIS, ROUTINE W REFLEX MICROSCOPIC
Bilirubin Urine: NEGATIVE
Glucose, UA: NEGATIVE mg/dL
Hgb urine dipstick: NEGATIVE
Ketones, ur: NEGATIVE mg/dL
Leukocytes, UA: NEGATIVE
Nitrite: NEGATIVE
Protein, ur: NEGATIVE mg/dL
Specific Gravity, Urine: 1.012 (ref 1.005–1.030)
pH: 7 (ref 5.0–8.0)

## 2017-09-17 MED ORDER — HYDROCOD POLST-CPM POLST ER 10-8 MG/5ML PO SUER
5.0000 mL | Freq: Once | ORAL | Status: AC
  Administered 2017-09-17: 5 mL via ORAL
  Filled 2017-09-17: qty 5

## 2017-09-17 MED ORDER — HYDROCOD POLST-CPM POLST ER 10-8 MG/5ML PO SUER: 5.0000 mL | Freq: Once | ORAL | Status: DC

## 2017-09-17 MED ORDER — HYDROCOD POLST-CPM POLST ER 10-8 MG/5ML PO SUER: 5.0000 mL | Freq: Two times a day (BID) | ORAL | 0 refills | Status: DC | PRN

## 2017-09-17 NOTE — Discharge Instructions (Signed)

## 2017-09-17 NOTE — MAU Provider Note (Signed)
History   G1 @ 29.2 wks in with c/o cough and now left sided back and rib pain when she breaths. States was treated for flu starting in the middle of the month but hasnot gotten a lot better even though she has been on Augmentin.  CSN: [id number]  Arrival date & time 09/17/17  1541   None     Chief Complaint  Patient presents with  . Back Pain    HPI  Past Medical History:  Diagnosis Date  . Anemia     Past Surgical History:  Procedure Laterality Date  . NO PAST SURGERIES      Family History  Problem Relation Age of Onset  . Hypertension Mother   . Hypertension Father   . Cancer Maternal Grandmother   . Heart disease Maternal Grandmother   . Diabetes Maternal Grandmother   . Cancer Maternal Grandfather   . Heart disease Maternal Grandfather     Social History   Tobacco Use  . Smoking status: Never Smoker  . Smokeless tobacco: Never Used  Substance Use Topics  . Alcohol use: No    Alcohol/week: 0.6 oz    Types: 1 Standard drinks or equivalent per week    Frequency: Never  . Drug use: No    OB History    Gravida Para Term Preterm AB Living   1             SAB TAB Ectopic Multiple Live Births                  Review of Systems  Constitutional: Negative.   HENT: Negative.   Eyes: Negative.   Respiratory: Positive for cough.   Cardiovascular: Negative.   Gastrointestinal: Negative.   Endocrine: Negative.   Genitourinary: Negative.   Musculoskeletal: Positive for back pain.  Skin: Negative.   Allergic/Immunologic: Negative.   Neurological: Negative.   Hematological: Negative.   Psychiatric/Behavioral: Negative.     Allergies  Patient has no known allergies.  Home Medications    BP (!) 109/59 (BP Location: Right Arm)   Pulse 84   Temp 98.2 F (36.8 C) (Oral)   Resp 16   Ht 5' 5.5" (1.664 m)   Wt 197 lb (89.4 kg)   SpO2 100%   BMI 32.28 kg/m   Physical Exam  Constitutional: She is oriented to person, place, and time. She appears  well-developed and well-nourished.  HENT:  Head: Normocephalic.  Eyes: Pupils are equal, round, and reactive to light.  Neck: Normal range of motion.  Cardiovascular: Normal rate, regular rhythm, normal heart sounds and intact distal pulses.  Pulmonary/Chest: Effort normal and breath sounds normal.  Abdominal: Soft. Bowel sounds are normal.  Genitourinary: Vagina normal and uterus normal.  Musculoskeletal: Normal range of motion.  Neurological: She is alert and oriented to person, place, and time. She has normal reflexes.  Skin: Skin is warm and dry.  Psychiatric: She has a normal mood and affect. Her behavior is normal. Judgment and thought content normal.    MAU Course  Procedures (including critical care time)  Labs Reviewed  URINALYSIS, ROUTINE W REFLEX MICROSCOPIC   No results found.   1. Chest pain made worse by breathing   2. Rib pain on left side   3. Cough       MDM  VSS, non productive cough noted. LCTAB. abd soft and gravid. CXR normal. POC discussed with Dr. Janee Mornalahan. Will d/c pt home with rx for Tussionex.

## 2017-09-17 NOTE — MAU Note (Signed)
Pt reports she was seen in office last week for a routine visit, was recently treated for flu and MD said in the office last week that she had decreased sounds in her left lower lobe. Pt now reports pain in mid left back and ribs on left side. States pain is very bad if she coughs or takes a deep breath.

## 2017-10-22 ENCOUNTER — Other Ambulatory Visit: Payer: Self-pay

## 2017-10-22 ENCOUNTER — Encounter (HOSPITAL_COMMUNITY): Payer: Self-pay | Admitting: *Deleted

## 2017-10-22 ENCOUNTER — Inpatient Hospital Stay (HOSPITAL_COMMUNITY)
Admission: AD | Admit: 2017-10-22 | Discharge: 2017-10-22 | Disposition: A | Discharge status: Home / Self Care | Payer: BC Managed Care – PPO | Source: Ambulatory Visit | Attending: Obstetrics and Gynecology | Admitting: Obstetrics and Gynecology

## 2017-10-22 DIAGNOSIS — O26853 Spotting complicating pregnancy, third trimester: Secondary | ICD-10-CM

## 2017-10-22 DIAGNOSIS — Z3A35 35 weeks gestation of pregnancy: Secondary | ICD-10-CM | POA: Insufficient documentation

## 2017-10-22 DIAGNOSIS — O4693 Antepartum hemorrhage, unspecified, third trimester: Secondary | ICD-10-CM | POA: Insufficient documentation

## 2017-10-22 DIAGNOSIS — N939 Abnormal uterine and vaginal bleeding, unspecified: Secondary | ICD-10-CM | POA: Diagnosis present

## 2017-10-22 NOTE — MAU Provider Note (Signed)
Chief Complaint  Patient presents with  . Vaginal Bleeding     First Provider Initiated Contact with Patient 10/22/17 0134       S: Deborah Huff  is a 31 y.o. y.o. year old G1P0 female at 2668w6d weeks gestation who presents to MAU reporting small amount of browth bleeding tonight that has now changed to red.   Contractions: Irreg, mild Vaginal bleeding: small Fetal movement: Nml  O:  Patient Vitals for the past 24 hrs:  BP Temp Temp src Pulse Resp Height Weight  10/22/17 0313 112/62 - Oral 79 16 - -  10/22/17 0028 112/63 97.6 F (36.4 C) - 74 18 5' 5.5" (1.664 m) 197 lb (89.4 kg)   General: NAD Heart: Regular rate Lungs: Normal rate and effort Abd: Soft, NT, Gravid, S=D Pelvic: NEFG, Neg LOF, scant creamy pink blood. No blood on pad through MAU visit.  Dilation: Closed Exam by:: V Harli Engelken CNM  EFM: 130, Moderate variability, 15 x 15 accelerations, no decelerations Toco: Rare w/ UI  MDM Discussed Hx, exam w/ Dr. Dareen PianoAnderson. Bleeding C/W Nml bloody show on exam. Agrees w/ POC. New orders: None.    A: 7368w6d week IUP Nml bloody show appropriate for gestational age. Not C/W abruption.  FHR reactive  P: Discharge home in stable condition per consult w/ Levi AlandAnderson, Mark E, MD. Bleeding precautions.  Labor precautions and fetal kick counts. Follow-up as scheduled for prenatal visit or sooner as needed if symptoms worsen. Return to maternity admissions as needed if symptoms worsen.  Katrinka BlazingSmith, [place], CNM 10/22/2017 3:28 AM  2

## 2017-10-22 NOTE — Discharge Instructions (Signed)
Vaginal Bleeding During Pregnancy, Third Trimester °A small amount of bleeding (spotting) from the vagina is relatively common in pregnancy. Various things can cause bleeding or spotting in pregnancy. Sometimes the bleeding is normal and is not a problem. However, bleeding during the third trimester can also be a sign of something serious for the mother and the baby. Be sure to tell your health care provider about any vaginal bleeding right away. °Some possible causes of vaginal bleeding during the third trimester include: °· The placenta may be partially or completely covering the opening to the cervix (placenta previa). °· The placenta may have separated from the uterus (abruption of the placenta). °· There may be an infection or growth on the cervix. °· You may be starting labor, called discharging of the mucus plug. °· The placenta may grow into the muscle layer of the uterus (placenta accreta). ° °Follow these instructions at home: °Watch your condition for any changes. The following actions may help to lessen any discomfort you are feeling: °· Follow your health care provider's instructions for limiting your activity. If your health care provider orders bed rest, you may need to stay in bed and only get up to use the bathroom. However, your health care provider may allow you to continue light activity. °· If needed, make plans for someone to help with your regular activities and responsibilities while you are on bed rest. °· Keep track of the number of pads you use each day, how often you change pads, and how soaked (saturated) they are. Write this down. °· Do not use tampons. Do not douche. °· Do not have sexual intercourse or orgasms until approved by your health care provider. °· Follow your health care provider's advice about lifting, driving, and physical activities. °· If you pass any tissue from your vagina, save the tissue so you can show it to your health care provider. °· Only take over-the-counter  or prescription medicines as directed by your health care provider. °· Do not take aspirin because it can make you bleed. °· Keep all follow-up appointments as directed by your health care provider. ° °Contact a health care provider if: °· You have any vaginal bleeding during any part of your pregnancy. °· You have cramps or labor pains. °· You have a fever, not controlled by medicine. °Get help right away if: °· You have severe cramps or pain in your back or belly (abdomen). °· You have chills. °· You have a gush of fluid from the vagina. °· You pass large clots or tissue from your vagina. °· Your bleeding increases. °· You feel light-headed or weak. °· You pass out. °· You feel less movement or no movement of the baby. °This information is not intended to replace advice given to you by your health care provider. Make sure you discuss any questions you have with your health care provider. °Document Released: 12/31/2002 Document Revised: 03/17/2016 Document Reviewed: 06/17/2013 °Elsevier Interactive Patient Education © 2018 [employer] ° °

## 2017-10-22 NOTE — MAU Note (Addendum)
Pt reports vaginal bleeding since 2300. Pt bleeding started off brownish, but is now red.Pt notices bleeding when wiping. She denies pain or lof

## 2017-10-22 NOTE — MAU Note (Signed)
Went to [facility] and wiped and was brown with red blood. NExt time was more red. Did see small blood clot. No placental problems. No pain

## 2017-10-24 NOTE — L&D Delivery Note (Signed)
Patient was C/C/+3 and pushed for 32 minutes with epidural.    NSVD  female infant, Apgars 8,9, weight P.   The patient had Huff midline and R labial first degree lacerations repaired with 2-0 and 3-0 vicryl R. Fundus was firm. EBL was expected amount. Placenta was delivered intact. Vagina was clear.  Delayed cord clamping done for 30-60 seconds while warming baby. Baby was vigorous and doing skin to skin with mother.  Deborah Huff

## 2017-10-25 LAB — OB RESULTS CONSOLE GBS: GBS: NEGATIVE

## 2017-11-16 ENCOUNTER — Encounter (HOSPITAL_COMMUNITY): Payer: Self-pay | Admitting: *Deleted

## 2017-11-16 ENCOUNTER — Inpatient Hospital Stay (HOSPITAL_COMMUNITY): Payer: BC Managed Care – PPO | Admitting: Anesthesiology

## 2017-11-16 ENCOUNTER — Other Ambulatory Visit: Payer: Self-pay

## 2017-11-16 ENCOUNTER — Inpatient Hospital Stay (HOSPITAL_COMMUNITY)
Admission: AD | Admit: 2017-11-16 | Discharge: 2017-11-18 | DRG: 807 | Disposition: A | Discharge status: Home / Self Care | Payer: BC Managed Care – PPO | Source: Ambulatory Visit | Attending: Obstetrics and Gynecology | Admitting: Obstetrics and Gynecology

## 2017-11-16 DIAGNOSIS — O9989 Other specified diseases and conditions complicating pregnancy, childbirth and the puerperium: Secondary | ICD-10-CM

## 2017-11-16 DIAGNOSIS — Z3A39 39 weeks gestation of pregnancy: Secondary | ICD-10-CM

## 2017-11-16 DIAGNOSIS — N898 Other specified noninflammatory disorders of vagina: Secondary | ICD-10-CM | POA: Diagnosis not present

## 2017-11-16 DIAGNOSIS — O9902 Anemia complicating childbirth: Principal | ICD-10-CM | POA: Diagnosis present

## 2017-11-16 DIAGNOSIS — Z3483 Encounter for supervision of other normal pregnancy, third trimester: Secondary | ICD-10-CM | POA: Diagnosis present

## 2017-11-16 DIAGNOSIS — D649 Anemia, unspecified: Secondary | ICD-10-CM | POA: Diagnosis present

## 2017-11-16 LAB — CBC
HCT: 31.1 % — ABNORMAL LOW (ref 36.0–46.0)
Hemoglobin: 10.2 g/dL — ABNORMAL LOW (ref 12.0–15.0)
MCH: 30.4 pg (ref 26.0–34.0)
MCHC: 32.8 g/dL (ref 30.0–36.0)
MCV: 92.8 fL (ref 78.0–100.0)
Platelets: 245 10*3/uL (ref 150–400)
RBC: 3.35 MIL/uL — ABNORMAL LOW (ref 3.87–5.11)
RDW: 14.2 % (ref 11.5–15.5)
WBC: 7.1 10*3/uL (ref 4.0–10.5)

## 2017-11-16 LAB — ABO/RH: ABO/RH(D): O POS

## 2017-11-16 LAB — TYPE AND SCREEN
ABO/RH(D): O POS
Antibody Screen: NEGATIVE

## 2017-11-16 LAB — POCT FERN TEST: POCT Fern Test: POSITIVE

## 2017-11-16 LAB — AMNISURE RUPTURE OF MEMBRANE (ROM) NOT AT ARMC: Amnisure ROM: POSITIVE

## 2017-11-16 MED ORDER — OXYTOCIN BOLUS FROM INFUSION
500.0000 mL | Freq: Once | INTRAVENOUS | Status: AC
  Administered 2017-11-16: 500 mL via INTRAVENOUS

## 2017-11-16 MED ORDER — OXYCODONE-ACETAMINOPHEN 5-325 MG PO TABS: 1.0000 | ORAL_TABLET | ORAL | Status: DC | PRN

## 2017-11-16 MED ORDER — PHENYLEPHRINE 40 MCG/ML (10ML) SYRINGE FOR IV PUSH (FOR BLOOD PRESSURE SUPPORT)
80.0000 ug | PREFILLED_SYRINGE | INTRAVENOUS | Status: DC | PRN
  Filled 2017-11-16: qty 5
  Filled 2017-11-16: qty 10

## 2017-11-16 MED ORDER — PHENYLEPHRINE 40 MCG/ML (10ML) SYRINGE FOR IV PUSH (FOR BLOOD PRESSURE SUPPORT)
80.0000 ug | PREFILLED_SYRINGE | INTRAVENOUS | Status: DC | PRN
Start: 2017-11-16 — End: 2017-11-17
  Filled 2017-11-16: qty 5
  Filled 2017-11-16: qty 10

## 2017-11-16 MED ORDER — EPHEDRINE 5 MG/ML INJ
10.0000 mg | INTRAVENOUS | Status: DC | PRN
  Filled 2017-11-16: qty 2

## 2017-11-16 MED ORDER — DIPHENHYDRAMINE HCL 50 MG/ML IJ SOLN: 12.5000 mg | INTRAMUSCULAR | Status: DC | PRN

## 2017-11-16 MED ORDER — FLEET ENEMA 7-19 GM/118ML RE ENEM: 1.0000 | ENEMA | RECTAL | Status: DC | PRN

## 2017-11-16 MED ORDER — SOD CITRATE-CITRIC ACID 500-334 MG/5ML PO SOLN: 30.0000 mL | ORAL | Status: DC | PRN

## 2017-11-16 MED ORDER — OXYCODONE-ACETAMINOPHEN 5-325 MG PO TABS: 2.0000 | ORAL_TABLET | ORAL | Status: DC | PRN

## 2017-11-16 MED ORDER — LACTATED RINGERS IV SOLN: 500.0000 mL | INTRAVENOUS | Status: DC | PRN

## 2017-11-16 MED ORDER — LACTATED RINGERS IV SOLN: 500.0000 mL | Freq: Once | INTRAVENOUS | Status: DC

## 2017-11-16 MED ORDER — FENTANYL 2.5 MCG/ML BUPIVACAINE 1/10 % EPIDURAL INFUSION (WH - ANES)
14.0000 mL/h | INTRAMUSCULAR | Status: DC | PRN
  Administered 2017-11-16: 14 mL/h via EPIDURAL
  Filled 2017-11-16: qty 100

## 2017-11-16 MED ORDER — ACETAMINOPHEN 325 MG PO TABS: 650.0000 mg | ORAL_TABLET | ORAL | Status: DC | PRN

## 2017-11-16 MED ORDER — OXYTOCIN 40 UNITS IN LACTATED RINGERS INFUSION - SIMPLE MED: 2.5000 [IU]/h | INTRAVENOUS | Status: DC

## 2017-11-16 MED ORDER — LIDOCAINE HCL (PF) 1 % IJ SOLN
30.0000 mL | INTRAMUSCULAR | Status: DC | PRN
  Filled 2017-11-16: qty 30

## 2017-11-16 MED ORDER — TERBUTALINE SULFATE 1 MG/ML IJ SOLN
0.2500 mg | Freq: Once | INTRAMUSCULAR | Status: DC | PRN
  Filled 2017-11-16: qty 1

## 2017-11-16 MED ORDER — LIDOCAINE HCL (PF) 1 % IJ SOLN
INTRAMUSCULAR | Status: DC | PRN
  Administered 2017-11-16: 6 mL via EPIDURAL

## 2017-11-16 MED ORDER — ONDANSETRON HCL 4 MG/2ML IJ SOLN: 4.0000 mg | Freq: Four times a day (QID) | INTRAMUSCULAR | Status: DC | PRN

## 2017-11-16 MED ORDER — OXYTOCIN 10 UNIT/ML IJ SOLN
10.0000 [IU] | Freq: Once | INTRAMUSCULAR | Status: AC
  Administered 2017-11-16: 10 [IU] via INTRAMUSCULAR

## 2017-11-16 MED ORDER — PHENYLEPHRINE 40 MCG/ML (10ML) SYRINGE FOR IV PUSH (FOR BLOOD PRESSURE SUPPORT): 80.0000 ug | PREFILLED_SYRINGE | INTRAVENOUS | Status: DC | PRN

## 2017-11-16 MED ORDER — LACTATED RINGERS IV SOLN: INTRAVENOUS | Status: DC

## 2017-11-16 MED ORDER — EPHEDRINE 5 MG/ML INJ
10.0000 mg | INTRAVENOUS | Status: DC | PRN
Start: 2017-11-16 — End: 2017-11-17
  Filled 2017-11-16: qty 2

## 2017-11-16 MED ORDER — EPHEDRINE 5 MG/ML INJ: 10.0000 mg | INTRAVENOUS | Status: DC | PRN

## 2017-11-16 MED ORDER — OXYTOCIN 40 UNITS IN LACTATED RINGERS INFUSION - SIMPLE MED
1.0000 m[IU]/min | INTRAVENOUS | Status: DC
  Administered 2017-11-16: 2 m[IU]/min via INTRAVENOUS
  Filled 2017-11-16: qty 1000

## 2017-11-16 MED ORDER — OXYTOCIN BOLUS FROM INFUSION: 500.0000 mL | Freq: Once | INTRAVENOUS | Status: DC

## 2017-11-16 MED ORDER — OXYTOCIN 10 UNIT/ML IJ SOLN
INTRAMUSCULAR | Status: AC
  Administered 2017-11-16: 10 [IU] via INTRAMUSCULAR
  Filled 2017-11-16: qty 1

## 2017-11-16 MED ORDER — LACTATED RINGERS IV SOLN
INTRAVENOUS | Status: DC
  Administered 2017-11-16: 16:00:00 via INTRAVENOUS

## 2017-11-16 NOTE — Anesthesia Procedure Notes (Signed)
Epidural Patient location during procedure: OB Start time: 11/16/2017 3:47 PM End time: 11/16/2017 3:58 PM  Staffing Anesthesiologist: Trevor IhaHouser, Lolamae Voisin A, MD Performed: anesthesiologist   Preanesthetic Checklist Completed: patient identified, site marked, surgical consent, pre-op evaluation, timeout performed, IV checked, risks and benefits discussed and monitors and equipment checked  Epidural Patient position: sitting Prep: site prepped and draped and DuraPrep Patient monitoring: continuous pulse ox and blood pressure Approach: midline Location: L3-L4 Injection technique: LOR air  Needle:  Needle type: Tuohy  Needle gauge: 17 G Needle length: 9 cm and 9 Needle insertion depth: 5 cm cm Catheter type: closed end flexible Catheter size: 19 Gauge Catheter at skin depth: 10 cm Test dose: negative  Assessment Events: blood not aspirated, injection not painful, no injection resistance, negative IV test and no paresthesia

## 2017-11-16 NOTE — Anesthesia Preprocedure Evaluation (Signed)
Anesthesia Evaluation  Patient identified by MRN, date of birth, ID band Patient awake    Reviewed: Allergy & Precautions, NPO status , Patient's Chart, lab work & pertinent test results  Airway Mallampati: II  TM Distance: >3 FB Neck ROM: Full    Dental no notable dental hx.    Pulmonary neg pulmonary ROS,    Pulmonary exam normal breath sounds clear to auscultation       Cardiovascular negative cardio ROS Normal cardiovascular exam Rhythm:Regular Rate:Normal     Neuro/Psych    GI/Hepatic negative GI ROS,   Endo/Other    Renal/GU      Musculoskeletal   Abdominal   Peds  Hematology   Anesthesia Other Findings   Reproductive/Obstetrics (+) Pregnancy                             Lab Results  Component Value Date   WBC 7.1 11/16/2017   HGB 10.2 (L) 11/16/2017   HCT 31.1 (L) 11/16/2017   MCV 92.8 11/16/2017   PLT 245 11/16/2017    Anesthesia Physical Anesthesia Plan  ASA: II  Anesthesia Plan: Epidural   Post-op Pain Management:    Induction:   PONV Risk Score and Plan:   Airway Management Planned:   Additional Equipment:   Intra-op Plan:   Post-operative Plan:   Informed Consent: I have reviewed the patients History and Physical, chart, labs and discussed the procedure including the risks, benefits and alternatives for the proposed anesthesia with the patient or authorized representative who has indicated his/her understanding and acceptance.     Plan Discussed with:   Anesthesia Plan Comments:         Anesthesia Quick Evaluation

## 2017-11-16 NOTE — MAU Note (Signed)
Pt. Negative for 1st fern, Dr. Bronson IngHorvath,provider notified. MAU provider to perform sterile exam and amnisure. Fern via MAU provider positive with mucus & streaks bloody show noted.  Amnisure positive. Dr. Henderson CloudHorvath notified, RN instructed to put in orders - provider, "not  near a computer".

## 2017-11-16 NOTE — MAU Note (Signed)
Pt. Off monitors, transferred to L&D for delivery.

## 2017-11-16 NOTE — MAU Provider Note (Signed)
S: Ms. Deborah Huff is a 32 y.o. G1P0 at 2287w3d  who presents to MAU today complaining of leaking of fluid since 1:00 am. She endorses vaginal bleeding, pink spotting that started when the leaking started. She endorses contractions. She reports normal fetal movement.    O: BP 116/62 (BP Location: Right Arm)   Pulse 78   Temp 97.6 F (36.4 C) (Oral)   Resp 16   Ht 5' 5.5" (1.664 m)   Wt 198 lb (89.8 kg)   LMP 02/13/2017   SpO2 99%   BMI 32.45 kg/m  GENERAL: Well-developed, well-nourished female in no acute distress.  HEAD: Normocephalic, atraumatic.  CHEST: Normal effort of breathing, regular heart rate ABDOMEN: Soft, nontender, gravid Perineum with large amount of clear, thick mucoid discharge. Specimen collected   Cervical exam:  Dilation: 3.5 Effacement (%): 90 Station: -3 Presentation: Vertex Exam by:: Jumana Paccione, NP   Fetal Monitoring: Baseline: 125 bpm Variability: moderate  Accelerations: 15x15 Decelerations: none Contractions: Irregular   Results for orders placed or performed during the hospital encounter of 11/16/17 (from the past 24 hour(s))  Am[facility] (rom)[facility]     Status: None   Collection Time: 11/16/17 12:03 PM  Result Value Ref Range   Amnisure ROM POSITIVE   POCT fern test     Status: Abnormal   Collection Time: 11/16/17 12:56 PM  Result Value Ref Range   POCT Fern Test Positive = ruptured amniotic membanes      A: SIUP at 487w3d  SROM  P: Report given to RN to contact MD on call for further instructions  Kelon Easom, Harolyn RutherfordJennifer I, NP 11/16/2017 12:04 PM

## 2017-11-16 NOTE — MAU Note (Signed)
[facility]

## 2017-11-16 NOTE — MAU Note (Signed)
Pt reports contractions and ? Leaking fluid since 1 am

## 2017-11-16 NOTE — H&P (Signed)
32 y.o. 5083w3d  G1P0 comes in c/o LOF and labor; pt fern pos per CNM who did sterile spec.  Otherwise has good fetal movement and no bleeding.  Past Medical History:  Diagnosis Date  . Anemia     Past Surgical History:  Procedure Laterality Date  . NO PAST SURGERIES      OB History  Gravida Para Term Preterm AB Living  1            SAB TAB Ectopic Multiple Live Births               # Outcome Date GA Lbr Len/2nd Weight Sex Delivery Anes PTL Lv  1 Current               Social History   Socioeconomic History  . Marital status: Married    Spouse name: Not on file  . Number of children: Not on file  . Years of education: Not on file  . Highest education level: Not on file  Social Needs  . Financial resource strain: Not on file  . Food insecurity - worry: Not on file  . Food insecurity - inability: Not on file  . Transportation needs - medical: Not on file  . Transportation needs - non-medical: Not on file  Occupational History  . Not on file  Tobacco Use  . Smoking status: Never Smoker  . Smokeless tobacco: Never Used  Substance and Sexual Activity  . Alcohol use: No    Alcohol/week: 0.6 oz    Types: 1 Standard drinks or equivalent per week    Frequency: Never  . Drug use: No  . Sexual activity: Yes  Other Topics Concern  . Not on file  Social History Narrative  . Not on file   Patient has no known allergies.    Prenatal Transfer Tool  Maternal Diabetes: No Genetic Screening: Normal Maternal Ultrasounds/Referrals: Normal Fetal Ultrasounds or other Referrals:  None Maternal Substance Abuse:  No Significant Maternal Medications:  Meds include: Other: iron Significant Maternal Lab Results: Lab values include: Other: anemia  Other PNC: uncomplicated.    Vitals:   11/16/17 1051  BP: 116/62  Pulse: 78  Resp: 16  Temp: 97.6 F (36.4 C)  TempSrc: Oral  SpO2: 99%  Weight: 198 lb (89.8 kg)  Height: 5' 5.5" (1.664 m)    Lungs/Cor:  NAD Abdomen:  soft,  gravid Ex:  no cords, erythema SVE:  3.5/90/-3 per CNM SSE:  +fern FHTs:  120s, good STV, NST R Toco:  q 3-10   A/P   Term SROM.  GBS neg.  Pit if no change in 2 hours.    Felesha Moncrieffe A

## 2017-11-16 NOTE — Anesthesia Pain Management Evaluation Note (Signed)
  CRNA Pain Management Visit Note  Patient: Deborah Huff, 32 y.o., female  "Hello [facility] am a [facility]. We have an anesthesia team available at all times to provide care throughout the hospital, including epidural management and anesthesia for C-section. I don't know your plan for the delivery whether it a natural birth, water birth, IV sedation, nitrous supplementation, doula or epidural, but we want to meet your pain goals."   1.Was your pain managed to your expectations on prior hospitalizations?   No prior hospitalizations  2.What is your expectation for pain management during this hospitalization?     Epidural  3.How can we help you reach that goal? Maintain epidural until delivery of infant.  Record the patient's initial score and the patient's pain goal.   Pain: 2  Pain Goal: 3 [facility] wants you to be able to say your pain was always managed very well.  Deborah Huff 11/16/2017

## 2017-11-17 LAB — CBC
HCT: 28.6 % — ABNORMAL LOW (ref 36.0–46.0)
Hemoglobin: 9.7 g/dL — ABNORMAL LOW (ref 12.0–15.0)
MCH: 31.4 pg (ref 26.0–34.0)
MCHC: 33.9 g/dL (ref 30.0–36.0)
MCV: 92.6 fL (ref 78.0–100.0)
Platelets: 215 10*3/uL (ref 150–400)
RBC: 3.09 MIL/uL — ABNORMAL LOW (ref 3.87–5.11)
RDW: 14.1 % (ref 11.5–15.5)
WBC: 10 10*3/uL (ref 4.0–10.5)

## 2017-11-17 LAB — RPR: RPR Ser Ql: NONREACTIVE

## 2017-11-17 MED ORDER — METHYLERGONOVINE MALEATE 0.2 MG/ML IJ SOLN: 0.2000 mg | INTRAMUSCULAR | Status: DC | PRN

## 2017-11-17 MED ORDER — TETANUS-DIPHTH-ACELL PERTUSSIS 5-2.5-18.5 LF-MCG/0.5 IM SUSP: 0.5000 mL | Freq: Once | INTRAMUSCULAR | Status: DC

## 2017-11-17 MED ORDER — FERROUS SULFATE 325 (65 FE) MG PO TABS
325.0000 mg | ORAL_TABLET | Freq: Two times a day (BID) | ORAL | Status: DC
  Administered 2017-11-17 – 2017-11-18 (×3): 325 mg via ORAL
  Filled 2017-11-17 (×4): qty 1

## 2017-11-17 MED ORDER — FLUTICASONE PROPIONATE 50 MCG/ACT NA SUSP
1.0000 | Freq: Every day | NASAL | Status: DC | PRN
  Administered 2017-11-17 – 2017-11-18 (×2): 1 via NASAL
  Filled 2017-11-17: qty 16

## 2017-11-17 MED ORDER — SIMETHICONE 80 MG PO CHEW: 80.0000 mg | CHEWABLE_TABLET | ORAL | Status: DC | PRN

## 2017-11-17 MED ORDER — ONDANSETRON HCL 4 MG PO TABS: 4.0000 mg | ORAL_TABLET | ORAL | Status: DC | PRN

## 2017-11-17 MED ORDER — MAGNESIUM HYDROXIDE 400 MG/5ML PO SUSP: 30.0000 mL | ORAL | Status: DC | PRN

## 2017-11-17 MED ORDER — METHYLERGONOVINE MALEATE 0.2 MG PO TABS: 0.2000 mg | ORAL_TABLET | ORAL | Status: DC | PRN

## 2017-11-17 MED ORDER — SENNOSIDES-DOCUSATE SODIUM 8.6-50 MG PO TABS
2.0000 | ORAL_TABLET | ORAL | Status: DC
  Administered 2017-11-18: 2 via ORAL

## 2017-11-17 MED ORDER — PRENATAL MULTIVITAMIN CH
1.0000 | ORAL_TABLET | Freq: Every day | ORAL | Status: DC
  Administered 2017-11-17 – 2017-11-18 (×2): 1 via ORAL
  Filled 2017-11-17 (×2): qty 1

## 2017-11-17 MED ORDER — ZOLPIDEM TARTRATE 5 MG PO TABS: 5.0000 mg | ORAL_TABLET | Freq: Every evening | ORAL | Status: DC | PRN

## 2017-11-17 MED ORDER — OXYCODONE-ACETAMINOPHEN 5-325 MG PO TABS: 2.0000 | ORAL_TABLET | ORAL | Status: DC | PRN

## 2017-11-17 MED ORDER — SODIUM CHLORIDE 0.9% FLUSH: 3.0000 mL | Freq: Two times a day (BID) | INTRAVENOUS | Status: DC

## 2017-11-17 MED ORDER — SODIUM CHLORIDE 0.9% FLUSH: 3.0000 mL | INTRAVENOUS | Status: DC | PRN

## 2017-11-17 MED ORDER — ACETAMINOPHEN 325 MG PO TABS
650.0000 mg | ORAL_TABLET | ORAL | Status: DC | PRN
  Administered 2017-11-17 (×2): 650 mg via ORAL
  Filled 2017-11-17 (×2): qty 2

## 2017-11-17 MED ORDER — COCONUT OIL OIL
1.0000 "application " | TOPICAL_OIL | Status: DC | PRN
  Administered 2017-11-17: 1 via TOPICAL
  Filled 2017-11-17: qty 120

## 2017-11-17 MED ORDER — MEASLES, MUMPS & RUBELLA VAC ~~LOC~~ INJ
0.5000 mL | INJECTION | Freq: Once | SUBCUTANEOUS | Status: DC
  Filled 2017-11-17: qty 0.5

## 2017-11-17 MED ORDER — DIBUCAINE 1 % RE OINT: 1.0000 "application " | TOPICAL_OINTMENT | RECTAL | Status: DC | PRN

## 2017-11-17 MED ORDER — LORATADINE 10 MG PO TABS: 10.0000 mg | ORAL_TABLET | Freq: Every day | ORAL | Status: DC | PRN

## 2017-11-17 MED ORDER — BENZOCAINE-MENTHOL 20-0.5 % EX AERO
1.0000 "application " | INHALATION_SPRAY | CUTANEOUS | Status: DC | PRN
  Administered 2017-11-17: 1 via TOPICAL
  Filled 2017-11-17: qty 56

## 2017-11-17 MED ORDER — IBUPROFEN 800 MG PO TABS
800.0000 mg | ORAL_TABLET | Freq: Three times a day (TID) | ORAL | Status: DC
  Administered 2017-11-17 – 2017-11-18 (×5): 800 mg via ORAL
  Filled 2017-11-17 (×5): qty 1

## 2017-11-17 MED ORDER — ONDANSETRON HCL 4 MG/2ML IJ SOLN: 4.0000 mg | INTRAMUSCULAR | Status: DC | PRN

## 2017-11-17 MED ORDER — SODIUM CHLORIDE 0.9 % IV SOLN: 250.0000 mL | INTRAVENOUS | Status: DC | PRN

## 2017-11-17 MED ORDER — WITCH HAZEL-GLYCERIN EX PADS: 1.0000 "application " | MEDICATED_PAD | CUTANEOUS | Status: DC | PRN

## 2017-11-17 MED ORDER — DIPHENHYDRAMINE HCL 25 MG PO CAPS: 25.0000 mg | ORAL_CAPSULE | Freq: Four times a day (QID) | ORAL | Status: DC | PRN

## 2017-11-17 NOTE — Progress Notes (Signed)
Patient is doing well.  She is ambulating, voiding, tolerating PO.  Pain control is good.  Lochia is appropriate  Vitals:   11/17/17 0003 11/17/17 0022 11/17/17 0121 11/17/17 0531  BP: 111/61 (!) 117/58 113/61 114/60  Pulse: 76 77 75 77  Resp:  18 16 18   Temp:  98 F (36.7 C) 98 F (36.7 C) 98 F (36.7 C)  TempSrc:  Oral Oral Oral  SpO2:      Weight:      Height:        NAD Fundus firm Ext: trace edema b/l  Lab Results  Component Value Date   WBC 10.0 11/17/2017   HGB 9.7 (L) 11/17/2017   HCT 28.6 (L) 11/17/2017   MCV 92.6 11/17/2017   PLT 215 11/17/2017    --/--/O POS, O POS (01/24 1235)/RI  A/P 31 y.o. G1P1001 PPD#1. Routine care.   Expect d/c tomorrow.    [facility] Main[facility]ANNA GEFFEL [employer]

## 2017-11-17 NOTE — Progress Notes (Signed)
Pt complains of congestion. Provider wants to begin with flonase and PRN Loratadine 10 mg PO daily.  Ormand Senn L Byren Pankow, RN

## 2017-11-17 NOTE — Anesthesia Postprocedure Evaluation (Signed)
Anesthesia Post Note  Patient: Deborah Huff  Procedure(s) Performed: AN AD HOC LABOR EPIDURAL     Patient location during evaluation: Mother Baby Anesthesia Type: Epidural Level of consciousness: awake Pain management: pain level controlled Vital Signs Assessment: post-procedure vital signs reviewed and stable Respiratory status: spontaneous breathing Cardiovascular status: stable Postop Assessment: epidural receding and patient able to bend at knees Anesthetic complications: no    Last Vitals:  Vitals:   11/17/17 0121 11/17/17 0531  BP: 113/61 114/60  Pulse: 75 77  Resp: 16 18  Temp: 36.7 C 36.7 C  SpO2:      Last Pain:  Vitals:   11/17/17 0556  TempSrc:   PainSc: 4    Pain Goal: Patients Stated Pain Goal: 8 (11/16/17 1510)               Edison PaceWILKERSON,[name]

## 2017-11-17 NOTE — Lactation Note (Signed)
This note was copied from a baby's chart. Lactation Consultation Note  Patient Name: Deborah Huff Likes GNFAO'ZToday's Date: 11/17/2017 Reason for consult: Initial assessment;Term  4119 hour old 2639w 3d female who is being exclusively BF by her P1 mother. Mom had baby STS when entering the room, she just finished nursing off one breast. Baby was able to latch after a few tries, able to hear some swallows; nipple noticed to be slightly pinched post-feeding, reinforced the importance of getting a deep latch.  Discussed with mom the importance of STS, feeding on cues instead of on schedule, BF brochure, feeding diary and BF resources. Mom complained of some transient soreness; she's already using coconut oil; but no visible signs or nipple trauma. She has a Medela DEBP at home.   Maternal Data Formula Feeding for Exclusion: No Has patient been taught Hand Expression?: Yes Does the patient have breastfeeding experience prior to this delivery?: No  Feeding Feeding Type: Breast Fed Length of feed: 10 min  LATCH Score Latch: Repeated attempts needed to sustain latch, nipple held in mouth throughout feeding, stimulation needed to elicit sucking reflex.  Audible Swallowing: A few with stimulation  Type of Nipple: Everted at rest and after stimulation(short shaft)  Comfort (Breast/Nipple): Filling, red/small blisters or bruises, mild/mod discomfort  Hold (Positioning): Assistance needed to correctly position infant at breast and maintain latch.  LATCH Score: 6  Interventions Interventions: Breast feeding basics reviewed;Assisted with latch;Skin to skin;Breast massage;Hand express;Reverse pressure;Breast compression;Support pillows;Adjust position;Position options;Expressed milk;Coconut oil  Lactation Tools Discussed/Used Tools: Coconut oil WIC Program: No   Consult Status Consult Status: Follow-up Date: 11/18/17 Follow-up type: In-patient    Langley Flatley Venetia ConstableS Jarold Macomber 11/17/2017, 5:50 PM

## 2017-11-18 MED ORDER — IBUPROFEN 800 MG PO TABS: 800.0000 mg | ORAL_TABLET | Freq: Three times a day (TID) | ORAL | 0 refills | Status: DC

## 2017-11-18 NOTE — Lactation Note (Signed)
This note was copied from a baby's chart. Lactation Consultation Note  Patient Name: Deborah Huff JXBJY'NToday's Date: 11/18/2017 Reason for consult: Follow-up assessment Mom states she had a rough night with cluster feeding. Baby at 8837 hours old.  4 voids and 3 stools. Nipples sore and cracked.  Mom feels nipple shield is helping.  Baby crying and showing feeding cues.  Assisted with positioning baby to breast using a 16 mm nipple shield.  Baby latched well after a few attempts and fed actively for 25 minutes.  Mom comfortable after initial latch on pain.  Small amount of colostrum in shield.  We discussed importance of pumping 4-6 times per day when using a nipple shield.  RN will initiate pumping and mom has a medela pump at home.  Comfort gels given with instructions.  We discussed using a small amount of formula if baby continues to act hungry despite cluster feeds.  Mom states she was ready to quit BF last night.  Recommended weight check with pedi tomorrow and lactation outpatient appointment next week.  Mom agreeable.  Maternal Data    Feeding Feeding Type: Breast Fed Length of feed: 25 min  LATCH Score Latch: Grasps breast easily, tongue down, lips flanged, rhythmical sucking.  Audible Swallowing: A few with stimulation  Type of Nipple: Everted at rest and after stimulation  Comfort (Breast/Nipple): Filling, red/small blisters or bruises, mild/mod discomfort  Hold (Positioning): Assistance needed to correctly position infant at breast and maintain latch.  LATCH Score: 7  Interventions    Lactation Tools Discussed/Used Tools: Nipple Shields Nipple shield size: 16   Consult Status      Deborah Huff S 11/18/2017, 10:57 AM

## 2017-11-18 NOTE — Lactation Note (Signed)
This note was copied from a baby's chart. Lactation Consultation Note Baby 3527 hrs old. Cluster feeding. Mom frustrated, baby frustrated. Mom has round breast, small nipples short shaft. Pinched when baby comes off breast. Mom has tried several different positions. Assisted in football position. Breast are filling, areola and nipple not as compressible to obtain deep latch.  Fitted mom w/#16 NS. Taught "C" hold. Baby latched and fed well. Mom has shells, has only worn approx. 30 min. Today. Encouraged to wear today. Hand expression taught w/colostrum noted. Hand pump given to pre-pump.  Taught application of NS.  Mom stated it feels much better. Encouraged to feel breast before and after feeding for transfer.  Patient Name: Deborah Huff's Date: 11/18/2017 Reason for consult: Follow-up assessment;Mother's request;Difficult latch   Maternal Data    Feeding Feeding Type: Breast Fed Length of feed: 15 min(still BF)  LATCH Score Latch: Grasps breast easily, tongue down, lips flanged, rhythmical sucking.  Audible Swallowing: A few with stimulation  Type of Nipple: Everted at rest and after stimulation(short shaft)  Comfort (Breast/Nipple): Filling, red/small blisters or bruises, mild/mod discomfort  Hold (Positioning): Assistance needed to correctly position infant at breast and maintain latch.  LATCH Score: 7  Interventions Interventions: Breast feeding basics reviewed;Coconut oil;Assisted with latch;Shells;Breast compression;Skin to skin;Adjust position;Breast massage;Support pillows;Hand pump;Position options;Hand express;Pre-pump if needed  Lactation Tools Discussed/Used Tools: Shells;Pump;Nipple Shields;Coconut oil Nipple shield size: 16 Shell Type: Inverted Breast pump type: Manual Pump Review: Setup, frequency, and cleaning;Milk Storage Initiated by:: Peri JeffersonL. Satomi Buda RN IBCLC Date initiated:: 11/18/17   Consult Status Consult Status: Follow-up Date:  11/19/17 Follow-up type: In-patient    [name], Diamond NickelLAURA G 11/18/2017, 1:28 AM

## 2017-11-18 NOTE — Discharge Summary (Signed)
Obstetric Discharge Summary Reason for Admission: onset of labor Prenatal Procedures: none Intrapartum Procedures: spontaneous vaginal delivery Postpartum Procedures: none Complications-Operative and Postpartum: 1 degree perineal laceration and labial laceration Hemoglobin  Date Value Ref Range Status  11/17/2017 9.7 (L) 12.0 - 15.0 g/dL Final   HCT  Date Value Ref Range Status  11/17/2017 28.6 (L) 36.0 - 46.0 % Final    Physical Exam:  General: alert, cooperative and appears stated age Lochia: appropriate Uterine Fundus: firm DVT Evaluation: No evidence of DVT seen on physical exam. Negative Homan's sign.  Discharge Diagnoses: Term Pregnancy-delivered  Discharge Information: Date: 11/18/2017 Activity: pelvic rest Diet: routine Medications: PNV and Ibuprofen Condition: stable Instructions: refer to practice specific booklet Discharge to: home Follow-up Information    Carrington ClampHorvath, Michelle, MD Follow up in 4 week(s).   Specialty:  Obstetrics and Gynecology Contact information: [address] [place] [id number] [phone]           Newborn Data: Live born female  Birth Weight: 7 lb 2.3 oz (3240 g) APGAR: 8, 9  Newborn Delivery   Birth date/time:  11/16/2017 22:00:00 Delivery type:  Vaginal, Spontaneous     Home with mother.  Deborah Huff GEFFEL Jnae Thomaston 11/18/2017, 9:10 AM

## 2017-11-18 NOTE — Progress Notes (Signed)
Patient is doing well.  She is ambulating, voiding, tolerating PO.  Pain control is good.  Lochia is appropriate.  Baby cluster feeding overnight, coconut oil for nipples  Vitals:   11/17/17 0531 11/17/17 1044 11/17/17 1909 11/18/17 0555  BP: 114/60 (!) 113/58 117/74 104/62  Pulse: 77 73 73 62  Resp: 18 17 18 16   Temp: 98 F (36.7 C) 98.1 F (36.7 C) 98.6 F (37 C) 97.6 F (36.4 C)  TempSrc: Oral Oral Oral Oral  SpO2:      Weight:      Height:        NAD Fundus firm Ext: no edema  Lab Results  Component Value Date   WBC 10.0 11/17/2017   HGB 9.7 (L) 11/17/2017   HCT 28.6 (L) 11/17/2017   MCV 92.6 11/17/2017   PLT 215 11/17/2017    --/--/O POS, O POS (01/24 1235)/RI  A/P 31 y.o. G1P1001 PPD#2. Routine care.   Meeting all goals--d/c to home today.    [facility] System[facility]ANNA GEFFEL [employer]

## 2017-11-19 ENCOUNTER — Ambulatory Visit: Payer: Self-pay

## 2017-11-19 NOTE — Lactation Note (Signed)
This note was copied from a baby'Huff chart. Lactation Consultation Note  Patient Name: Deborah Huff UJWJX'BToday'Huff Date: 11/19/2017 Reason for consult: Follow-up assessment;Difficult latch;Nipple pain/trauma Mom and baby for discharge today.  Mom states she feels much better about the feedings today.  Nipples still sore but improved from yesterday.  Baby is currently latched to left breast with a 16 mm nipple shield.  Baby is obtaining more depth today.  She is actively feeding with a few swallows.  Discussed milk coming to volume.  Pediatrician not concerned with 8% weight loss.  Instructed to keep a feeding diary at home.  Encouraged to call prn.  Message left with clinic to call for outpatient appointment.  Maternal Data    Feeding Feeding Type: Breast Fed  LATCH Score Latch: Grasps breast easily, tongue down, lips flanged, rhythmical sucking.(with nipple shield)  Audible Swallowing: A few with stimulation  Type of Nipple: Everted at rest and after stimulation  Comfort (Breast/Nipple): Filling, red/small blisters or bruises, mild/mod discomfort  Hold (Positioning): No assistance needed to correctly position infant at breast.  LATCH Score: 8  Interventions    Lactation Tools Discussed/Used Tools: Nipple Shields Nipple shield size: 16   Consult Status Consult Status: Complete    Deborah Huff 11/19/2017, 8:59 AM

## 2018-07-03 ENCOUNTER — Encounter (HOSPITAL_BASED_OUTPATIENT_CLINIC_OR_DEPARTMENT_OTHER): Payer: Self-pay

## 2018-07-03 ENCOUNTER — Other Ambulatory Visit: Payer: Self-pay

## 2018-07-03 ENCOUNTER — Emergency Department (HOSPITAL_BASED_OUTPATIENT_CLINIC_OR_DEPARTMENT_OTHER)
Admission: EM | Admit: 2018-07-03 | Discharge: 2018-07-04 | Disposition: A | Discharge status: Home / Self Care | Payer: BC Managed Care – PPO | Attending: Emergency Medicine | Admitting: Emergency Medicine

## 2018-07-03 ENCOUNTER — Emergency Department (HOSPITAL_BASED_OUTPATIENT_CLINIC_OR_DEPARTMENT_OTHER): Payer: BC Managed Care – PPO

## 2018-07-03 DIAGNOSIS — R1010 Upper abdominal pain, unspecified: Secondary | ICD-10-CM

## 2018-07-03 DIAGNOSIS — Z79899 Other long term (current) drug therapy: Secondary | ICD-10-CM | POA: Diagnosis not present

## 2018-07-03 DIAGNOSIS — K8000 Calculus of gallbladder with acute cholecystitis without obstruction: Secondary | ICD-10-CM | POA: Insufficient documentation

## 2018-07-03 LAB — URINALYSIS, ROUTINE W REFLEX MICROSCOPIC
Bilirubin Urine: NEGATIVE
Glucose, UA: NEGATIVE mg/dL
Hgb urine dipstick: NEGATIVE
Ketones, ur: NEGATIVE mg/dL
Leukocytes, UA: NEGATIVE
Nitrite: NEGATIVE
Protein, ur: NEGATIVE mg/dL
Specific Gravity, Urine: 1.02 (ref 1.005–1.030)
pH: 7 (ref 5.0–8.0)

## 2018-07-03 LAB — CBC WITH DIFFERENTIAL/PLATELET
Basophils Absolute: 0 10*3/uL (ref 0.0–0.1)
Basophils Relative: 0 %
Eosinophils Absolute: 0.1 10*3/uL (ref 0.0–0.7)
Eosinophils Relative: 1 %
HCT: 35.9 % — ABNORMAL LOW (ref 36.0–46.0)
Hemoglobin: 12.1 g/dL (ref 12.0–15.0)
Lymphocytes Relative: 12 %
Lymphs Abs: 1.2 10*3/uL (ref 0.7–4.0)
MCH: 30.3 pg (ref 26.0–34.0)
MCHC: 33.7 g/dL (ref 30.0–36.0)
MCV: 90 fL (ref 78.0–100.0)
Monocytes Absolute: 0.5 10*3/uL (ref 0.1–1.0)
Monocytes Relative: 5 %
Neutro Abs: 7.8 10*3/uL — ABNORMAL HIGH (ref 1.7–7.7)
Neutrophils Relative %: 82 %
Platelets: 221 10*3/uL (ref 150–400)
RBC: 3.99 MIL/uL (ref 3.87–5.11)
RDW: 11.6 % (ref 11.5–15.5)
WBC: 9.6 10*3/uL (ref 4.0–10.5)

## 2018-07-03 LAB — COMPREHENSIVE METABOLIC PANEL
ALT: 87 U/L — ABNORMAL HIGH (ref 0–44)
AST: 147 U/L — ABNORMAL HIGH (ref 15–41)
Albumin: 4.4 g/dL (ref 3.5–5.0)
Alkaline Phosphatase: 59 U/L (ref 38–126)
Anion gap: 12 (ref 5–15)
BUN: 9 mg/dL (ref 6–20)
CO2: 23 mmol/L (ref 22–32)
Calcium: 8.9 mg/dL (ref 8.9–10.3)
Chloride: 102 mmol/L (ref 98–111)
Creatinine, Ser: 0.94 mg/dL (ref 0.44–1.00)
GFR calc Af Amer: >60 mL/min (ref >60)
GFR calc non Af Amer: >60 mL/min (ref >60)
Glucose, Bld: 104 mg/dL — ABNORMAL HIGH (ref 70–99)
Potassium: 3.2 mmol/L — ABNORMAL LOW (ref 3.5–5.1)
Sodium: 137 mmol/L (ref 135–145)
Total Bilirubin: 0.8 mg/dL (ref 0.3–1.2)
Total Protein: 7.7 g/dL (ref 6.5–8.1)

## 2018-07-03 LAB — LIPASE, BLOOD: Lipase: 25 U/L (ref 11–51)

## 2018-07-03 LAB — PREGNANCY, URINE: Preg Test, Ur: NEGATIVE

## 2018-07-03 MED ORDER — HYDROCODONE-ACETAMINOPHEN 5-325 MG PO TABS: 1.0000 | ORAL_TABLET | Freq: Four times a day (QID) | ORAL | 0 refills | Status: DC | PRN

## 2018-07-03 MED ORDER — SODIUM CHLORIDE 0.9 % IV SOLN
INTRAVENOUS | Status: DC
  Administered 2018-07-03: 125 mL/h via INTRAVENOUS

## 2018-07-03 MED ORDER — SODIUM CHLORIDE 0.9 % IV BOLUS
1000.0000 mL | Freq: Once | INTRAVENOUS | Status: AC
  Administered 2018-07-03: 1000 mL via INTRAVENOUS

## 2018-07-03 MED ORDER — MORPHINE SULFATE (PF) 4 MG/ML IV SOLN
4.0000 mg | Freq: Once | INTRAVENOUS | Status: AC
  Administered 2018-07-03: 4 mg via INTRAVENOUS
  Filled 2018-07-03: qty 1

## 2018-07-03 MED ORDER — ONDANSETRON HCL 4 MG/2ML IJ SOLN
4.0000 mg | Freq: Once | INTRAMUSCULAR | Status: AC
  Administered 2018-07-03: 4 mg via INTRAVENOUS
  Filled 2018-07-03: qty 2

## 2018-07-03 NOTE — ED Provider Notes (Signed)
[facility] EMERGENCY DEPARTMENT Provider Note   CSN: [id number] Arrival date & time: 07/03/18  1954     History   Chief Complaint Chief Complaint  Patient presents with  . Abdominal Pain    HPI Deborah Huff is a 32 y.o. female.  Patient is a 32 year old female with a history of anemia no past surgeries presenting with sudden onset of upper abdominal pain that started approximately 2 hours prior to arrival.  Patient states when she woke up this morning she had some mild discomfort in her abdomen which went away quickly.  She ate a very light breakfast and did not eat anything the rest of the day.  Around 6:00 tonight she developed a severe pain in her upper abdomen in the middle that intermittently radiated to her back.  It made it difficult to breathe and particular positions and movement seem to make it worse.  She denied vomiting, nausea or fever.  Last menstrual period was at the end of August.  She denies any vaginal or urinary symptoms.  She has never had anything like this before.  The history is provided by the patient.  Abdominal Pain   This is a new problem. The current episode started 1 to 2 hours ago. The problem occurs constantly. The problem has not changed since onset.The pain is associated with an unknown factor. The pain is located in the epigastric region. The quality of the pain is sharp, shooting, colicky and throbbing. The pain is at a severity of 8/10. The pain is severe. Associated symptoms include anorexia and nausea. Pertinent negatives include fever, diarrhea, vomiting, dysuria and frequency. Nothing aggravates the symptoms. Nothing relieves the symptoms.    Past Medical History:  Diagnosis Date  . Anemia     Patient Active Problem List   Diagnosis Date Noted  . Postpartum state 11/17/2017  . Normal labor 11/16/2017  . Labor without complication 11/16/2017    Past Surgical History:  Procedure Laterality Date  . NO PAST SURGERIES        OB History    Gravida  1   Para  1   Term  1   Preterm      AB      Living  1     SAB      TAB      Ectopic      Multiple  0   Live Births  1            Home Medications    Prior to Admission medications   Medication Sig Start Date End Date Taking? Authorizing Provider  acetaminophen (TYLENOL) 500 MG tablet Take 500 mg by mouth every 6 (six) hours as needed for mild pain or headache.     [provider]  cetirizine (ZYRTEC ALLERGY) 10 MG tablet Take 1 tablet (10 mg total) at bedtime by mouth. 08/31/17   Degele, Kandra Nicolas, MD  ferrous sulfate 325 (65 FE) MG tablet Take 325 mg daily with breakfast by mouth.    [provider]  ibuprofen (ADVIL,MOTRIN) 800 MG tablet Take 1 tablet (800 mg total) by mouth 3 (three) times daily. 11/18/17   Marlow Baars, MD  Prenatal Vit-Fe Fumarate-FA (PRENATAL MULTIVITAMIN) TABS tablet Take 1 tablet daily at 12 noon by mouth.    [provider]  ranitidine (ZANTAC) 150 MG tablet Take 1 tablet (150 mg total) 2 (two) times daily by mouth. 08/31/17   Degele, Kandra Nicolas, MD  vitamin B-12 (CYANOCOBALAMIN) 1000  MCG tablet Take 2,000 mcg daily by mouth.    [provider]    Family History Family History  Problem Relation Age of Onset  . Hypertension Mother   . Hypertension Father   . Cancer Maternal Grandmother   . Heart disease Maternal Grandmother   . Diabetes Maternal Grandmother   . Cancer Maternal Grandfather   . Heart disease Maternal Grandfather     Social History Social History   Tobacco Use  . Smoking status: Never Smoker  . Smokeless tobacco: Never Used  Substance Use Topics  . Alcohol use: No    Alcohol/week: 1.0 standard drinks    Types: 1 Standard drinks or equivalent per week    Frequency: Never  . Drug use: No     Allergies   Patient has no known allergies.   Review of Systems Review of Systems  Constitutional: Negative for fever.  Respiratory: Positive for cough.         Mild dry cough over the last few days but no shortness of breath  Gastrointestinal: Positive for abdominal pain, anorexia and nausea. Negative for diarrhea and vomiting.  Genitourinary: Negative for dysuria and frequency.  All other systems reviewed and are negative.    Physical Exam Updated Vital Signs BP (!) 130/91 (BP Location: Left Arm)   Pulse 76   Temp 98.3 F (36.8 C) (Oral)   Resp (!) 24   Ht 5\' 5"  (1.651 m)   Wt 72.6 kg   LMP 06/19/2018   SpO2 100%   BMI 26.63 kg/m   Physical Exam  Constitutional: She is oriented to person, place, and time. She appears well-developed and well-nourished. No distress.  HENT:  Head: Normocephalic and atraumatic.  Mouth/Throat: Oropharynx is clear and moist.  Eyes: Pupils are equal, round, and reactive to light. Conjunctivae and EOM are normal.  Neck: Normal range of motion. Neck supple.  Cardiovascular: Normal rate, regular rhythm and intact distal pulses.  No murmur heard. Pulmonary/Chest: Effort normal and breath sounds normal. No respiratory distress. She has no wheezes. She has no rales.  Abdominal: Soft. She exhibits no distension. There is tenderness in the epigastric area and periumbilical area. There is guarding. There is no rebound, no tenderness at McBurney's point and negative Murphy's sign.  Small umbilical hernia which is reducible and nontender  Musculoskeletal: Normal range of motion. She exhibits no edema or tenderness.  Neurological: She is alert and oriented to person, place, and time.  Skin: Skin is warm and dry. No rash noted. No erythema.  Psychiatric: She has a normal mood and affect. Her behavior is normal.  Nursing note and vitals reviewed.    ED Treatments / Results  Labs (all labs ordered are listed, but only abnormal results are displayed) Labs Reviewed  CBC WITH DIFFERENTIAL/PLATELET - Abnormal; Notable for the following components:      Result Value   HCT 35.9 (*)    Neutro Abs 7.8 (*)    All  other components within normal limits  COMPREHENSIVE METABOLIC PANEL - Abnormal; Notable for the following components:   Potassium 3.2 (*)    Glucose, Bld 104 (*)    AST 147 (*)    ALT 87 (*)    All other components within normal limits  URINALYSIS, ROUTINE W REFLEX MICROSCOPIC  PREGNANCY, URINE  LIPASE, BLOOD    EKG None  Radiology US Abdomen Limited Ruq  Result Date: 07/03/2018 CLINICAL DATA:  Upper abdominal pain for 4 hours, nausea, pain 10/10 EXAM: ULTRASOUND  ABDOMEN LIMITED RIGHT UPPER QUADRANT COMPARISON:  None FINDINGS: Gallbladder: Markedly thickened gallbladder wall. Minimal pericholecystic fluid. Edema within gallbladder wall. Unable to accurately assess for sonographic Eulah Pont sign due to prior morphine administration. No definite shadowing gallstones identified. Common bile duct: Diameter: 3 mm diameter, normal Liver: Normal parenchymal echogenicity. No focal mass lesion or nodularity. Minimal intrahepatic biliary dilatation appears to be present though no extrahepatic biliary dilatation is seen. Portal vein is patent on color Doppler imaging with normal direction of blood flow towards the liver. No additional RIGHT upper quadrant ascites. IMPRESSION: Markedly thickened gallbladder wall with pericholecystic fluid. No definite gallstones identified; unable to assess for sonographic Murphy sign due to narcotic administration. Findings suspicious for acute cholecystitis. Normal caliber CBD though there appears to be minimal intrahepatic biliary dilatation, recommend correlation with LFTs. Electronically Signed   By: Ulyses Southward M.D.   On: 07/03/2018 23:01    Procedures Procedures (including critical care time)  Medications Ordered in ED Medications  sodium chloride 0.9 % bolus 1,000 mL (1,000 mLs Intravenous New Bag/Given 07/03/18 2044)  0.9 %  sodium chloride infusion (has no administration in time range)  morphine 4 MG/ML injection 4 mg (4 mg Intravenous Given 07/03/18 2048)   ondansetron (ZOFRAN) injection 4 mg (4 mg Intravenous Given 07/03/18 2048)     Initial Impression / Assessment and Plan / ED Course  I have reviewed the triage vital signs and the nursing notes.  Pertinent labs & imaging results that were available during my care of the patient were reviewed by me and considered in my medical decision making (see chart for details).     Healthy 32 year old female presenting with sudden onset of severe epigastric and periumbilical pain that started tonight about 6 PM.  Patient reports that breathing seems to make it worse in certain positions.  She appears uncomfortable on exam and has mild tachypnea but otherwise vital signs are within normal limits.  She has epigastric and periumbilical tenderness but no Murphy sign or McBurney's point tenderness.  Patient has a small umbilical hernia but it is reducible and nontender. Patient's UA without acute findings and UPT is negative.  Given patient's history and exam concern for cholecystitis versus pancreatitis versus hepatitis versus gastritis.  Low suspicion for pelvic etiology or appendicitis.  Low suspicion for lung pathology.  9:39 PM CBC within normal limits, CMP with elevated LFTs AST of 147 and ALT of 87 with a normal lipase.  We will do a right upper quadrant ultrasound to further evaluate the gallbladder.  11:27 PM Ultrasound shows marked thickened gallbladder wall with pericholecystic fluid.  No sonographic Eulah Pont sign is present.  No definite gallstones are seen the findings suspicious for cholecystitis.  Normal common bile duct caliber but minimal intrahepatic biliary dilatation which would correlate with her mildly elevated LFTs.  Spoke with Dr. Daphine Deutscher about the patient's case.  After 4 mg of morphine earlier patient's pain has almost completely resolved.  She states she feels much better.  Feel it is reasonable for patient to be discharged home to follow-up in clinic in the next few days for evaluation and  scheduling of elective cholecystectomy.  Final Clinical Impressions(s) / ED Diagnoses   Final diagnoses:  Upper abdominal pain  Calculus of gallbladder with acute cholecystitis without obstruction    ED Discharge Orders         Ordered    HYDROcodone-acetaminophen (NORCO/VICODIN) 5-325 MG tablet  Every 6 hours PRN     07/03/18 2331  Gwyneth Sprout, MD 07/03/18 [phone]

## 2018-07-03 NOTE — Discharge Instructions (Signed)
Avoid raw vegetables, fatty and fried foods.  If pain returns and is not controllable or you start having fever or persistent vomiting please return

## 2018-07-03 NOTE — ED Triage Notes (Signed)
Pt c/o upper abdominal pain for the last two hours and nausea

## 2018-07-03 NOTE — ED Notes (Signed)
Patient transported to Ultrasound 

## 2018-07-12 ENCOUNTER — Ambulatory Visit: Payer: Self-pay | Admitting: General Surgery

## 2018-07-23 ENCOUNTER — Other Ambulatory Visit: Payer: Self-pay

## 2018-07-23 ENCOUNTER — Encounter (HOSPITAL_COMMUNITY)
Admission: RE | Admit: 2018-07-23 | Discharge: 2018-07-23 | Disposition: A | Discharge status: Home / Self Care | Payer: BC Managed Care – PPO | Source: Ambulatory Visit | Attending: General Surgery | Admitting: General Surgery

## 2018-07-23 ENCOUNTER — Encounter (HOSPITAL_COMMUNITY): Payer: Self-pay

## 2018-07-23 DIAGNOSIS — Z01812 Encounter for preprocedural laboratory examination: Secondary | ICD-10-CM | POA: Insufficient documentation

## 2018-07-23 LAB — CBC
HCT: 37.4 % (ref 36.0–46.0)
Hemoglobin: 12.1 g/dL (ref 12.0–15.0)
MCH: 29.9 pg (ref 26.0–34.0)
MCHC: 32.4 g/dL (ref 30.0–36.0)
MCV: 92.3 fL (ref 78.0–100.0)
Platelets: 248 10*3/uL (ref 150–400)
RBC: 4.05 MIL/uL (ref 3.87–5.11)
RDW: 11.7 % (ref 11.5–15.5)
WBC: 3.6 10*3/uL — ABNORMAL LOW (ref 4.0–10.5)

## 2018-07-23 NOTE — Progress Notes (Signed)
PCP - OBGYN - Dr. Waynard Reeds Cardiologist - patient denies  Chest x-ray - 09/17/2017 EKG - 08/31/2017 Stress Test - patient denies ECHO - patient denies Cardiac Cath - patient denies  Sleep Study - patient denies  Anesthesia review: n/a  Patient denies shortness of breath, fever, cough and chest pain at PAT appointment   Patient verbalized understanding of instructions that were given to them at the PAT appointment. Patient was also instructed that they will need to review over the PAT instructions again at home before surgery.

## 2018-07-23 NOTE — Pre-Procedure Instructions (Signed)
DAYSY SANTINI  07/23/2018      [id number] [place], [place] - [address] AT [facility] OF [facility] & [address] [place] [address] Phone: [phone] Fax: [phone]  [facility] #[id number] [place], [place] - [address] AT [facility] OF [address] & [address] [place] [address] Phone: [phone] Fax: [phone]    Your procedure is scheduled on 07/27/2018.  Report to [facility] Admitting at 0530 A.M.  Call this number if you have problems the morning of surgery:  [phone]   Remember:  Do not eat or drink after midnight.    Take these medicines the morning of surgery with A SIP OF WATER: certirizine (Zyrtec) - if needed Hydrocodone-acetaminophen (Norco) - if needed Norlyda  7 days prior to surgery STOP taking any Aspirin(unless otherwise instructed by your surgeon), Aleve, Naproxen, Ibuprofen, Motrin, Advil, Goody's, BC's, all herbal medications, fish oil, and all vitamins     Do not wear jewelry, make-up or nail polish.  Do not wear lotions, powders, or perfumes, or deodorant.  Do not shave 48 hours prior to surgery.    Do not bring valuables to the hospital.  [facility] is not responsible for any belongings or valuables.  Contacts, eyeglasses, dentures or bridgework may not be worn into surgery.  Leave your suitcase in the car.  After surgery it may be brought to your room.  For patients admitted to the hospital, discharge time will be determined by your treatment team.  Patients discharged the day of surgery will not be allowed to drive home.   Name and phone number of your driver:    Special instructions:   [facility]- Preparing For Surgery  Before surgery, you can play an important role. Because skin is not sterile, your skin needs to be as free of germs as possible. You can reduce the number of germs on your skin by washing with CHG  (chlorahexidine gluconate) Soap before surgery.  CHG is an antiseptic cleaner which kills germs and bonds with the skin to continue killing germs even after washing.    Oral Hygiene is also important to reduce your risk of infection.  Remember - BRUSH YOUR TEETH THE MORNING OF SURGERY WITH YOUR REGULAR TOOTHPASTE  Please do not use if you have an allergy to CHG or antibacterial soaps. If your skin becomes reddened/irritated stop using the CHG.  Do not shave (including legs and underarms) for at least 48 hours prior to first CHG shower. It is OK to shave your face.  Please follow these instructions carefully.   1. Shower the NIGHT BEFORE SURGERY and the MORNING OF SURGERY with CHG.   2. If you chose to wash your hair, wash your hair first as usual with your normal shampoo.  3. After you shampoo, rinse your hair and body thoroughly to remove the shampoo.  4. Use CHG as you would any other liquid soap. You can apply CHG directly to the skin and wash gently with a scrungie or a clean washcloth.   5. Apply the CHG Soap to your body ONLY FROM THE NECK DOWN.  Do not use on open wounds or open sores. Avoid contact with your eyes, ears, mouth and genitals (private parts). Wash Face and genitals (private parts)  with your normal soap.  6. Wash thoroughly, paying special attention to the area where your surgery will be  performed.  7. Thoroughly rinse your body with warm water from the neck down.  8. DO NOT shower/wash with your normal soap after using and rinsing off the CHG Soap.  9. Pat yourself dry with a CLEAN TOWEL.  10. Wear CLEAN PAJAMAS to bed the night before surgery, wear comfortable clothes the morning of surgery  11. Place CLEAN SHEETS on your bed the night of your first shower and DO NOT SLEEP WITH PETS.    Day of Surgery: Shower as stated above. Do not apply any deodorants/lotions.  Please wear clean clothes to the hospital/surgery center.   Remember to brush your teeth WITH  YOUR REGULAR TOOTHPASTE.    Please read over the following fact sheets that you were given.

## 2018-07-27 ENCOUNTER — Ambulatory Visit (HOSPITAL_COMMUNITY): Payer: BC Managed Care – PPO | Admitting: Anesthesiology

## 2018-07-27 ENCOUNTER — Ambulatory Visit (HOSPITAL_COMMUNITY)
Admission: RE | Admit: 2018-07-27 | Discharge: 2018-07-27 | Disposition: A | Discharge status: Home / Self Care | Payer: BC Managed Care – PPO | Source: Ambulatory Visit | Attending: General Surgery | Admitting: General Surgery

## 2018-07-27 ENCOUNTER — Ambulatory Visit (HOSPITAL_COMMUNITY): Payer: BC Managed Care – PPO

## 2018-07-27 ENCOUNTER — Other Ambulatory Visit: Payer: Self-pay

## 2018-07-27 ENCOUNTER — Encounter (HOSPITAL_COMMUNITY): Payer: Self-pay

## 2018-07-27 ENCOUNTER — Encounter (HOSPITAL_COMMUNITY)
Admission: RE | Disposition: A | Discharge status: Home / Self Care | Payer: Self-pay | Source: Ambulatory Visit | Attending: General Surgery

## 2018-07-27 DIAGNOSIS — Z419 Encounter for procedure for purposes other than remedying health state, unspecified: Secondary | ICD-10-CM

## 2018-07-27 DIAGNOSIS — K5900 Constipation, unspecified: Secondary | ICD-10-CM | POA: Diagnosis not present

## 2018-07-27 DIAGNOSIS — Z8249 Family history of ischemic heart disease and other diseases of the circulatory system: Secondary | ICD-10-CM | POA: Diagnosis not present

## 2018-07-27 DIAGNOSIS — K819 Cholecystitis, unspecified: Secondary | ICD-10-CM | POA: Diagnosis present

## 2018-07-27 DIAGNOSIS — R197 Diarrhea, unspecified: Secondary | ICD-10-CM | POA: Insufficient documentation

## 2018-07-27 DIAGNOSIS — K811 Chronic cholecystitis: Secondary | ICD-10-CM | POA: Insufficient documentation

## 2018-07-27 HISTORY — PX: CHOLECYSTECTOMY: SHX55

## 2018-07-27 LAB — POCT PREGNANCY, URINE: Preg Test, Ur: NEGATIVE

## 2018-07-27 SURGERY — LAPAROSCOPIC CHOLECYSTECTOMY WITH INTRAOPERATIVE CHOLANGIOGRAM
Anesthesia: General

## 2018-07-27 MED ORDER — FENTANYL CITRATE (PF) 250 MCG/5ML IJ SOLN
INTRAMUSCULAR | Status: AC
  Filled 2018-07-27: qty 5

## 2018-07-27 MED ORDER — HYDROMORPHONE HCL 1 MG/ML IJ SOLN: 0.2500 mg | INTRAMUSCULAR | Status: DC | PRN

## 2018-07-27 MED ORDER — CELECOXIB 200 MG PO CAPS
200.0000 mg | ORAL_CAPSULE | ORAL | Status: AC
  Administered 2018-07-27: 200 mg via ORAL
  Filled 2018-07-27: qty 1

## 2018-07-27 MED ORDER — IOPAMIDOL (ISOVUE-300) INJECTION 61%
INTRAVENOUS | Status: AC
  Filled 2018-07-27: qty 50

## 2018-07-27 MED ORDER — MEPERIDINE HCL 50 MG/ML IJ SOLN: 6.2500 mg | INTRAMUSCULAR | Status: DC | PRN

## 2018-07-27 MED ORDER — LIDOCAINE HCL (CARDIAC) PF 100 MG/5ML IV SOSY
PREFILLED_SYRINGE | INTRAVENOUS | Status: DC | PRN
  Administered 2018-07-27: 40 mg via INTRAVENOUS

## 2018-07-27 MED ORDER — ESMOLOL HCL 100 MG/10ML IV SOLN
INTRAVENOUS | Status: AC
  Filled 2018-07-27: qty 10

## 2018-07-27 MED ORDER — ONDANSETRON HCL 4 MG/2ML IJ SOLN
INTRAMUSCULAR | Status: AC
  Filled 2018-07-27: qty 2

## 2018-07-27 MED ORDER — PROPOFOL 10 MG/ML IV BOLUS
INTRAVENOUS | Status: AC
  Filled 2018-07-27: qty 20

## 2018-07-27 MED ORDER — ROCURONIUM BROMIDE 50 MG/5ML IV SOSY
PREFILLED_SYRINGE | INTRAVENOUS | Status: AC
  Filled 2018-07-27: qty 5

## 2018-07-27 MED ORDER — CEFAZOLIN SODIUM-DEXTROSE 2-4 GM/100ML-% IV SOLN
2.0000 g | INTRAVENOUS | Status: AC
  Administered 2018-07-27: 2 g via INTRAVENOUS
  Filled 2018-07-27: qty 100

## 2018-07-27 MED ORDER — OXYCODONE HCL 5 MG/5ML PO SOLN: 5.0000 mg | Freq: Once | ORAL | Status: DC | PRN

## 2018-07-27 MED ORDER — MIDAZOLAM HCL 5 MG/5ML IJ SOLN
INTRAMUSCULAR | Status: DC | PRN
  Administered 2018-07-27: 2 mg via INTRAVENOUS

## 2018-07-27 MED ORDER — SODIUM CHLORIDE 0.9 % IR SOLN
Status: DC | PRN
  Administered 2018-07-27: 1000 mL

## 2018-07-27 MED ORDER — SUGAMMADEX SODIUM 200 MG/2ML IV SOLN
INTRAVENOUS | Status: DC | PRN
  Administered 2018-07-27: 200 mg via INTRAVENOUS

## 2018-07-27 MED ORDER — CHLORHEXIDINE GLUCONATE CLOTH 2 % EX PADS: 6.0000 | MEDICATED_PAD | Freq: Once | CUTANEOUS | Status: DC

## 2018-07-27 MED ORDER — HYDROCODONE-ACETAMINOPHEN 5-325 MG PO TABS: 1.0000 | ORAL_TABLET | Freq: Four times a day (QID) | ORAL | 0 refills | Status: DC | PRN

## 2018-07-27 MED ORDER — ONDANSETRON HCL 4 MG/2ML IJ SOLN
INTRAMUSCULAR | Status: DC | PRN
  Administered 2018-07-27: 4 mg via INTRAVENOUS

## 2018-07-27 MED ORDER — LIDOCAINE 2% (20 MG/ML) 5 ML SYRINGE
INTRAMUSCULAR | Status: AC
  Filled 2018-07-27: qty 5

## 2018-07-27 MED ORDER — ACETAMINOPHEN 500 MG PO TABS
1000.0000 mg | ORAL_TABLET | ORAL | Status: AC
  Administered 2018-07-27: 1000 mg via ORAL
  Filled 2018-07-27: qty 2

## 2018-07-27 MED ORDER — ESMOLOL HCL 100 MG/10ML IV SOLN
INTRAVENOUS | Status: DC | PRN
  Administered 2018-07-27: 20 mg via INTRAVENOUS

## 2018-07-27 MED ORDER — OXYCODONE HCL 5 MG PO TABS: 5.0000 mg | ORAL_TABLET | Freq: Once | ORAL | Status: DC | PRN

## 2018-07-27 MED ORDER — DEXAMETHASONE SODIUM PHOSPHATE 10 MG/ML IJ SOLN
INTRAMUSCULAR | Status: AC
  Filled 2018-07-27: qty 1

## 2018-07-27 MED ORDER — BUPIVACAINE-EPINEPHRINE (PF) 0.25% -1:200000 IJ SOLN
INTRAMUSCULAR | Status: AC
  Filled 2018-07-27: qty 30

## 2018-07-27 MED ORDER — FENTANYL CITRATE (PF) 100 MCG/2ML IJ SOLN
INTRAMUSCULAR | Status: DC | PRN
  Administered 2018-07-27: 50 ug via INTRAVENOUS
  Administered 2018-07-27 (×2): 100 ug via INTRAVENOUS
  Administered 2018-07-27: 50 ug via INTRAVENOUS

## 2018-07-27 MED ORDER — PROPOFOL 10 MG/ML IV BOLUS
INTRAVENOUS | Status: DC | PRN
  Administered 2018-07-27: 150 mg via INTRAVENOUS

## 2018-07-27 MED ORDER — GABAPENTIN 300 MG PO CAPS
300.0000 mg | ORAL_CAPSULE | ORAL | Status: AC
  Administered 2018-07-27: 300 mg via ORAL
  Filled 2018-07-27: qty 1

## 2018-07-27 MED ORDER — 0.9 % SODIUM CHLORIDE (POUR BTL) OPTIME
TOPICAL | Status: DC | PRN
  Administered 2018-07-27: 1000 mL

## 2018-07-27 MED ORDER — BUPIVACAINE-EPINEPHRINE 0.25% -1:200000 IJ SOLN
INTRAMUSCULAR | Status: DC | PRN
  Administered 2018-07-27: 20 mL

## 2018-07-27 MED ORDER — SODIUM CHLORIDE 0.9 % IV SOLN
INTRAVENOUS | Status: DC | PRN
  Administered 2018-07-27: 7 mL

## 2018-07-27 MED ORDER — PROMETHAZINE HCL 25 MG/ML IJ SOLN: 6.2500 mg | INTRAMUSCULAR | Status: DC | PRN

## 2018-07-27 MED ORDER — LACTATED RINGERS IV SOLN
INTRAVENOUS | Status: DC | PRN
  Administered 2018-07-27 (×3): via INTRAVENOUS

## 2018-07-27 MED ORDER — ROCURONIUM BROMIDE 100 MG/10ML IV SOLN
INTRAVENOUS | Status: DC | PRN
  Administered 2018-07-27: 50 mg via INTRAVENOUS

## 2018-07-27 MED ORDER — MIDAZOLAM HCL 2 MG/2ML IJ SOLN
INTRAMUSCULAR | Status: AC
  Filled 2018-07-27: qty 2

## 2018-07-27 MED ORDER — DEXAMETHASONE SODIUM PHOSPHATE 10 MG/ML IJ SOLN
INTRAMUSCULAR | Status: DC | PRN
  Administered 2018-07-27: 10 mg via INTRAVENOUS

## 2018-07-27 SURGICAL SUPPLY — 38 items
APPLIER CLIP 5 13 M/L LIGAMAX5 (MISCELLANEOUS) ×2
BLADE CLIPPER SURG (BLADE) IMPLANT
CANISTER SUCT 3000ML PPV (MISCELLANEOUS) ×2 IMPLANT
CATH REDDICK CHOLANGI 4FR 50CM (CATHETERS) ×2 IMPLANT
CHLORAPREP W/TINT 26ML (MISCELLANEOUS) ×2 IMPLANT
CLIP APPLIE 5 13 M/L LIGAMAX5 (MISCELLANEOUS) ×1 IMPLANT
COVER MAYO STAND STRL (DRAPES) ×2 IMPLANT
COVER SURGICAL LIGHT HANDLE (MISCELLANEOUS) ×2 IMPLANT
COVER WAND RF STERILE (DRAPES) IMPLANT
DERMABOND ADVANCED (GAUZE/BANDAGES/DRESSINGS) ×1
DERMABOND ADVANCED .7 DNX12 (GAUZE/BANDAGES/DRESSINGS) ×1 IMPLANT
DRAPE C-ARM 42X72 X-RAY (DRAPES) ×2 IMPLANT
ELECT REM PT RETURN 9FT ADLT (ELECTROSURGICAL) ×2
ELECTRODE REM PT RTRN 9FT ADLT (ELECTROSURGICAL) ×1 IMPLANT
GLOVE BIO SURGEON STRL SZ7.5 (GLOVE) ×2 IMPLANT
GLOVE BIOGEL PI IND STRL 7.0 (GLOVE) ×2 IMPLANT
GLOVE BIOGEL PI INDICATOR 7.0 (GLOVE) ×2
GLOVE SURG SS PI 7.0 STRL IVOR (GLOVE) ×4 IMPLANT
GOWN STRL REUS W/ TWL LRG LVL3 (GOWN DISPOSABLE) ×3 IMPLANT
GOWN STRL REUS W/TWL LRG LVL3 (GOWN DISPOSABLE) ×3
IV CATH 14GX2 1/4 (CATHETERS) ×2 IMPLANT
KIT BASIN OR (CUSTOM PROCEDURE TRAY) ×2 IMPLANT
KIT TURNOVER KIT B (KITS) ×2 IMPLANT
NS IRRIG 1000ML POUR BTL (IV SOLUTION) ×2 IMPLANT
PAD ARMBOARD 7.5X6 YLW CONV (MISCELLANEOUS) ×2 IMPLANT
POUCH SPECIMEN RETRIEVAL 10MM (ENDOMECHANICALS) ×2 IMPLANT
SCISSORS LAP 5X35 DISP (ENDOMECHANICALS) ×2 IMPLANT
SET IRRIG TUBING LAPAROSCOPIC (IRRIGATION / IRRIGATOR) ×2 IMPLANT
SLEEVE ENDOPATH XCEL 5M (ENDOMECHANICALS) ×4 IMPLANT
SPECIMEN JAR SMALL (MISCELLANEOUS) IMPLANT
SUT MNCRL AB 4-0 PS2 18 (SUTURE) ×2 IMPLANT
TOWEL OR 17X24 6PK STRL BLUE (TOWEL DISPOSABLE) ×2 IMPLANT
TOWEL OR 17X26 10 PK STRL BLUE (TOWEL DISPOSABLE) IMPLANT
TRAY LAPAROSCOPIC MC (CUSTOM PROCEDURE TRAY) ×2 IMPLANT
TROCAR XCEL BLUNT TIP 100MML (ENDOMECHANICALS) ×2 IMPLANT
TROCAR XCEL NON-BLD 5MMX100MML (ENDOMECHANICALS) ×2 IMPLANT
TUBING INSUFFLATION (TUBING) ×2 IMPLANT
WATER STERILE IRR 1000ML POUR (IV SOLUTION) ×2 IMPLANT

## 2018-07-27 NOTE — Anesthesia Procedure Notes (Signed)
Procedure Name: Intubation Date/Time: 07/27/2018 7:55 AM Performed by: Sharlyn Odonnel T, CRNA Pre-anesthesia Checklist: Patient identified, Emergency Drugs available, Suction available and Patient being monitored Patient Re-evaluated:Patient Re-evaluated prior to induction Oxygen Delivery Method: Circle system utilized Preoxygenation: Pre-oxygenation with 100% oxygen Induction Type: IV induction Ventilation: Mask ventilation without difficulty Laryngoscope Size: Miller and 2 Grade View: Grade I Tube type: Oral Tube size: 7.5 mm Number of attempts: 1 Airway Equipment and Method: Patient positioned with wedge pillow and Stylet Placement Confirmation: ETT inserted through vocal cords under direct vision,  positive ETCO2 and breath sounds checked- equal and bilateral Secured at: 21 cm Tube secured with: Tape Dental Injury: Teeth and Oropharynx as per pre-operative assessment

## 2018-07-27 NOTE — Interval H&P Note (Signed)
History and Physical Interval Note:  07/27/2018 7:38 AM  Deborah Huff  has presented today for surgery, with the diagnosis of cholelithiasis  The various methods of treatment have been discussed with the patient and family. After consideration of risks, benefits and other options for treatment, the patient has consented to  Procedure(s): LAPAROSCOPIC CHOLECYSTECTOMY WITH INTRAOPERATIVE CHOLANGIOGRAM ERAS PATHWAY (N/A) as a surgical intervention .  The patient's history has been reviewed, patient examined, no change in status, stable for surgery.  I have reviewed the patient's chart and labs.  Questions were answered to the patient's satisfaction.     Chevis Pretty III

## 2018-07-27 NOTE — H&P (Signed)
Deborah Huff  Location: [facility] Surgery Patient #: [id number] DOB: [birth year] Married / Language: English / Race: Black or African American Female   History of Present Illness  The patient is a 32 year old female who presents with abdominal pain. We are asked to see the patient in consultation by Dr. Gwyneth Sprout to evaluate her for gallstones. The patient is a 32 year old black female who began having severe right upper quadrant pain last Tuesday. She went to the emergency department where an ultrasound showed significant cholecystitis but no stones in the gallbladder. She was not admitted to the hospital and has been slowly getting better since then. She does have some chronic problems with constipation alternating with diarrhea. She is otherwise in good health.   Past Surgical History  No pertinent past surgical history   Diagnostic Studies History  Colonoscopy  never Mammogram  never Pap Smear  never  Allergies  No Known Drug Allergies   Medication History  Norlyda (0.35MG  Tablet, Oral) Active. Medications Reconciled  Social History Alcohol use  Occasional alcohol use. Caffeine use  Carbonated beverages, Tea. No drug use  Tobacco use  Never smoker.  Family History  Arthritis  Father. Bleeding disorder  Mother. Colon Cancer  Family Members In General. Diabetes Mellitus  Family Members In General. Heart Disease  Family Members In General. Heart disease in female family member before age 11  Heart disease in female family member before age 31  Hypertension  Father, Mother. Ischemic Bowel Disease  Mother. Seizure disorder  Father.  Pregnancy / Birth History  Age at menarche  16 years. Contraceptive History  Depo-provera, Oral contraceptives. Gravida  1 Irregular periods  Maternal age  27-30 Para  1  Other Problems  No pertinent past medical history     Review of Systems  General Present- Appetite Loss. Not  Present- Chills, Fatigue, Fever, Night Sweats, Weight Gain and Weight Loss. Skin Not Present- Change in Wart/Mole, Dryness, Hives, Jaundice, New Lesions, Non-Healing Wounds, Rash and Ulcer. HEENT Present- Seasonal Allergies, Sore Throat and Wears glasses/contact lenses. Not Present- Earache, Hearing Loss, Hoarseness, Nose Bleed, Oral Ulcers, Ringing in the Ears, Sinus Pain, Visual Disturbances and Yellow Eyes. Respiratory Present- Snoring. Not Present- Bloody sputum, Chronic Cough, Difficulty Breathing and Wheezing. Breast Not Present- Breast Mass, Breast Pain, Nipple Discharge and Skin Changes. Cardiovascular Not Present- Chest Pain, Difficulty Breathing Lying Down, Leg Cramps, Palpitations, Rapid Heart Rate, Shortness of Breath and Swelling of Extremities. Gastrointestinal Present- Bloating, Change in Bowel Habits and Constipation. Not Present- Abdominal Pain, Bloody Stool, Chronic diarrhea, Difficulty Swallowing, Excessive gas, Gets full quickly at meals, Hemorrhoids, Indigestion, Nausea, Rectal Pain and Vomiting. Female Genitourinary Not Present- Frequency, Nocturia, Painful Urination, Pelvic Pain and Urgency. Musculoskeletal Present- Back Pain. Not Present- Joint Pain, Joint Stiffness, Muscle Pain, Muscle Weakness and Swelling of Extremities. Neurological Not Present- Decreased Memory, Fainting, Headaches, Numbness, Seizures, Tingling, Tremor, Trouble walking and Weakness. Psychiatric Not Present- Anxiety, Bipolar, Change in Sleep Pattern, Depression, Fearful and Frequent crying. Endocrine Not Present- Cold Intolerance, Excessive Hunger, Hair Changes, Heat Intolerance, Hot flashes and New Diabetes. Hematology Not Present- Blood Thinners, Easy Bruising, Excessive bleeding, Gland problems, HIV and Persistent Infections.  Vitals  Weight: 171.38 lb Height: 65.5in Body Surface Area: 1.86 m Body Mass Index: 28.08 kg/m  BP: 112/72 (Sitting, Left Arm, Standard)       Physical  Exam General Mental Status-Alert. General Appearance-Consistent with stated age. Hydration-Well hydrated. Voice-Normal.  Head and Neck Head-normocephalic, atraumatic with no lesions  or palpable masses. Trachea-midline. Thyroid Gland Characteristics - normal size and consistency.  Eye Eyeball - Bilateral-Extraocular movements intact. Sclera/Conjunctiva - Bilateral-No scleral icterus.  Chest and Lung Exam Chest and lung exam reveals -quiet, even and easy respiratory effort with no use of accessory muscles and on auscultation, normal breath sounds, no adventitious sounds and normal vocal resonance. Inspection Chest Wall - Normal. Back - normal.  Cardiovascular Cardiovascular examination reveals -normal heart sounds, regular rate and rhythm with no murmurs and normal pedal pulses bilaterally.  Abdomen Inspection Inspection of the abdomen reveals - No Hernias. Skin - Scar - no surgical scars. Palpation/Percussion Palpation and Percussion of the abdomen reveal - Soft, Non Tender, No Rebound tenderness, No Rigidity (guarding) and No hepatosplenomegaly. Auscultation Auscultation of the abdomen reveals - Bowel sounds normal.  Neurologic Neurologic evaluation reveals -alert and oriented x 3 with no impairment of recent or remote memory. Mental Status-Normal.  Musculoskeletal Normal Exam - Left-Upper Extremity Strength Normal and Lower Extremity Strength Normal. Normal Exam - Right-Upper Extremity Strength Normal and Lower Extremity Strength Normal.  Lymphatic Head & Neck  General Head & Neck Lymphatics: Bilateral - Description - Normal. Axillary  General Axillary Region: Bilateral - Description - Normal. Tenderness - Non Tender. Femoral & Inguinal  Generalized Femoral & Inguinal Lymphatics: Bilateral - Description - Normal. Tenderness - Non Tender.    Assessment & Plan ACUTE CHOLECYSTITIS (K81.0) Impression: The patient appears to have had a  recent episode of acute cholecystitis. Because of the risk of further painful episodes and possible pancreatitis at that she would benefit from having her gallbladder removed. She would also like to have this done. I have discussed with her in detail the risks and benefits of the operation as well as some of the technical aspects including the risk of common duct injury and diarrhea and she understands and wishes to proceed. I will plan for a laparoscopic cholecystectomy with intraoperative cholangiogram. I will also start her on several days of Cipro for the cholecystitis. Current Plans Started Ciprofloxacin HCl 500 MG Oral Tablet, 1 (one) Tablet two times daily, #10, 07/09/2018, No Refill.

## 2018-07-27 NOTE — Addendum Note (Signed)
Addendum  created 07/27/18 1429 by Reine Just, CRNA   Intraprocedure Event edited

## 2018-07-27 NOTE — Anesthesia Postprocedure Evaluation (Signed)
Anesthesia Post Note  Patient: Deborah Huff  Procedure(s) Performed: LAPAROSCOPIC CHOLECYSTECTOMY WITH INTRAOPERATIVE CHOLANGIOGRAM ERAS PATHWAY (N/A )     Patient location during evaluation: PACU Anesthesia Type: General Level of consciousness: awake and alert Pain management: pain level controlled Vital Signs Assessment: post-procedure vital signs reviewed and stable Respiratory status: spontaneous breathing, nonlabored ventilation and respiratory function stable Cardiovascular status: blood pressure returned to baseline and stable Postop Assessment: no apparent nausea or vomiting Anesthetic complications: no    Last Vitals:  Vitals:   07/27/18 1000 07/27/18 1002  BP:  125/84  Pulse: 85 78  Resp: 16 18  Temp:    SpO2: 100% 100%    Last Pain:  Vitals:   07/27/18 1002  TempSrc:   PainSc: 0-No pain                 Lowella Curb

## 2018-07-27 NOTE — Anesthesia Preprocedure Evaluation (Addendum)
Anesthesia Evaluation  Patient identified by MRN, date of birth, ID band Patient awake    Reviewed: Allergy & Precautions, NPO status , Patient's Chart, lab work & pertinent test results  Airway Mallampati: II  TM Distance: >3 FB Neck ROM: Full    Dental no notable dental hx. (+) Teeth Intact, Dental Advisory Given   Pulmonary neg pulmonary ROS,    Pulmonary exam normal breath sounds clear to auscultation       Cardiovascular negative cardio ROS Normal cardiovascular exam Rhythm:Regular Rate:Normal     Neuro/Psych negative neurological ROS  negative psych ROS   GI/Hepatic negative GI ROS, Neg liver ROS,   Endo/Other  negative endocrine ROS  Renal/GU negative Renal ROS  negative genitourinary   Musculoskeletal negative musculoskeletal ROS (+)   Abdominal   Peds negative pediatric ROS (+)  Hematology negative hematology ROS (+)   Anesthesia Other Findings Cholelithiasis  Reproductive/Obstetrics negative OB ROS                            Lab Results  Component Value Date   WBC 3.6 (L) 07/23/2018   HGB 12.1 07/23/2018   HCT 37.4 07/23/2018   MCV 92.3 07/23/2018   PLT 248 07/23/2018    Anesthesia Physical  Anesthesia Plan  ASA: II  Anesthesia Plan: General   Post-op Pain Management:    Induction: Intravenous  PONV Risk Score and Plan: 3 and Ondansetron, Dexamethasone and Midazolam  Airway Management Planned: Oral ETT  Additional Equipment:   Intra-op Plan:   Post-operative Plan: Extubation in OR  Informed Consent: I have reviewed the patients History and Physical, chart, labs and discussed the procedure including the risks, benefits and alternatives for the proposed anesthesia with the patient or authorized representative who has indicated his/her understanding and acceptance.   Dental advisory given  Plan Discussed with: CRNA  Anesthesia Plan Comments:         Anesthesia Quick Evaluation

## 2018-07-27 NOTE — Transfer of Care (Signed)
Immediate Anesthesia Transfer of Care Note  Patient: Deborah Huff  Procedure(s) Performed: LAPAROSCOPIC CHOLECYSTECTOMY WITH INTRAOPERATIVE CHOLANGIOGRAM ERAS PATHWAY (N/A )  Patient Location: PACU  Anesthesia Type:General  Level of Consciousness: drowsy  Airway & Oxygen Therapy: Patient Spontanous Breathing and Patient connected to nasal cannula oxygen  Post-op Assessment: Report given to RN, Post -op Vital signs reviewed and stable and Patient moving all extremities  Post vital signs: Reviewed and stable  Last Vitals:  Vitals Value Taken Time  BP 104/60 07/27/2018  9:20 AM  Temp    Pulse 91 07/27/2018  9:22 AM  Resp 20 07/27/2018  9:22 AM  SpO2 100 % 07/27/2018  9:22 AM  Vitals shown include unvalidated device data.  Last Pain:  Vitals:   07/27/18 0617  TempSrc:   PainSc: 0-No pain         Complications: No apparent anesthesia complications

## 2018-07-27 NOTE — Op Note (Signed)
07/27/2018  9:03 AM  PATIENT:  Deborah Huff  32 y.o. female  PRE-OPERATIVE DIAGNOSIS:  cholecystitis  POST-OPERATIVE DIAGNOSIS:  cholecystitis  PROCEDURE:  Procedure(s): LAPAROSCOPIC CHOLECYSTECTOMY WITH INTRAOPERATIVE CHOLANGIOGRAM ERAS PATHWAY (N/A)  SURGEON:  Surgeon(s) and Role:    * Griselda Miner, MD - Primary  PHYSICIAN ASSISTANT:   ASSISTANTS: none   ANESTHESIA:   local and general  EBL:  50 mL   BLOOD ADMINISTERED:none  DRAINS: none   LOCAL MEDICATIONS USED:  MARCAINE     SPECIMEN:  Source of Specimen:  gallbladder  DISPOSITION OF SPECIMEN:  PATHOLOGY  COUNTS:  YES  TOURNIQUET:  * No tourniquets in log *  DICTATION: .Dragon Dictation     Procedure: After informed consent was obtained the patient was brought to the operating room and placed in the supine position on the operating room table. After adequate induction of general anesthesia the patient's abdomen was prepped with ChloraPrep allowed to dry and draped in usual sterile manner. An appropriate timeout was performed. The area below the umbilicus was infiltrated with quarter percent  Marcaine. A small incision was made with a 15 blade knife. The incision was carried down through the subcutaneous tissue bluntly with a hemostat and [employer] retractors. The linea alba was identified. The linea alba was incised with a 15 blade knife and each side was grasped with Coker clamps. The preperitoneal space was then probed with a hemostat until the peritoneum was opened and access was gained to the abdominal cavity. A 0 Vicryl pursestring stitch was placed in the fascia surrounding the opening. A Hassan cannula was then placed through the opening and anchored in place with the previously placed Vicryl purse string stitch. The abdomen was insufflated with carbon dioxide without difficulty. A laparoscope was inserted through the [facility] cannula in the right upper quadrant was inspected. Next the epigastric region was  infiltrated with % Marcaine. A small incision was made with a 15 blade knife. A 5 mm port was placed bluntly through this incision into the abdominal cavity under direct vision. Next 2 sites were chosen laterally on the right side of the abdomen for placement of 5 mm ports. Each of these areas was infiltrated with quarter percent Marcaine. Small stab incisions were made with a 15 blade knife. 5 mm ports were then placed bluntly through these incisions into the abdominal cavity under direct vision without difficulty. A blunt grasper was placed through the lateralmost 5 mm port and used to grasp the dome of the gallbladder and elevated anteriorly and superiorly. Another blunt grasper was placed through the other 5 mm port and used to retract the body and neck of the gallbladder. A dissector was placed through the epigastric port and using the electrocautery the peritoneal reflection at the gallbladder neck was opened. Blunt dissection was then carried out in this area until the gallbladder neck-cystic duct junction was readily identified and a good window was created. A single clip was placed on the gallbladder neck. A small  ductotomy was made just below the clip with laparoscopic scissors. A 14-gauge Angiocath was then placed through the anterior abdominal wall under direct vision. A Reddick cholangiogram catheter was then placed through the Angiocath and flushed. The catheter was then placed in the cystic duct and anchored in place with a clip. A cholangiogram was obtained that showed no filling defects good emptying into the duodenum an adequate length on the cystic duct. The anchoring clip and catheters were then removed from the patient.  3 clips were placed proximally on the cystic duct and the duct was divided between the 2 sets of clips. Posterior to this the cystic artery was identified and again dissected bluntly in a circumferential manner until a good window  was created. 2 clips were placed proximally  and one distally on the artery and the artery was divided between the 2 sets of clips. Next a laparoscopic hook cautery device was used to separate the gallbladder from the liver bed. Prior to completely detaching the gallbladder from the liver bed the liver bed was inspected and several small bleeding points were coagulated with the electrocautery until the area was completely hemostatic. The gallbladder was then detached the rest of it from the liver bed without difficulty. A laparoscopic bag was inserted through the hassan port. The laparoscope was moved to the epigastric port. The gallbladder was placed within the bag and the bag was sealed.  The bag with the gallbladder was then removed with the [facility] cannula through the infraumbilical port without difficulty. The fascial defect was then closed with the previously placed Vicryl pursestring stitch as well as with another figure-of-eight 0 Vicryl stitch. The liver bed was inspected again and found to be hemostatic. The abdomen was irrigated with copious amounts of saline until the effluent was clear. The ports were then removed under direct vision without difficulty and were found to be hemostatic. The gas was allowed to escape. The skin incisions were all closed with interrupted 4-0 Monocryl subcuticular stitches. Dermabond dressings were applied. The patient tolerated the procedure well. At the end of the case all needle sponge and instrument counts were correct. The patient was then awakened and taken to recovery in stable condition  PLAN OF CARE: Discharge to home after PACU  PATIENT DISPOSITION:  PACU - hemodynamically stable.   Delay start of Pharmacological VTE agent (>24hrs) due to surgical blood loss or risk of bleeding: not applicable

## 2018-07-28 ENCOUNTER — Encounter (HOSPITAL_COMMUNITY): Payer: Self-pay | Admitting: General Surgery

## 2018-09-15 IMAGING — US US ABDOMEN LIMITED
1 series · 14 of 25 positions shown · non-contrast
Comparison: None

CLINICAL DATA: Upper abdominal pain for 4 hours, nausea, pain [DATE]

EXAM:
ULTRASOUND ABDOMEN LIMITED RIGHT UPPER QUADRANT

[Series 1: us abdomen limited · 0.14mm/px · 14 of 43 slices shown]
[im 1/43]
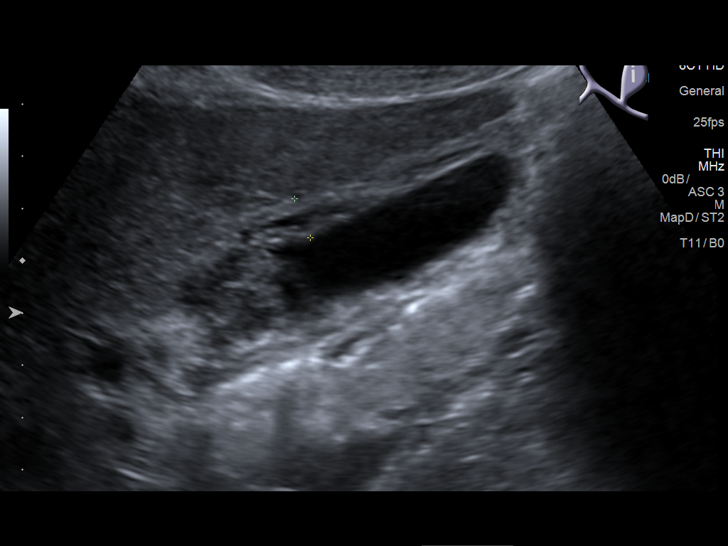
[im 4/43]
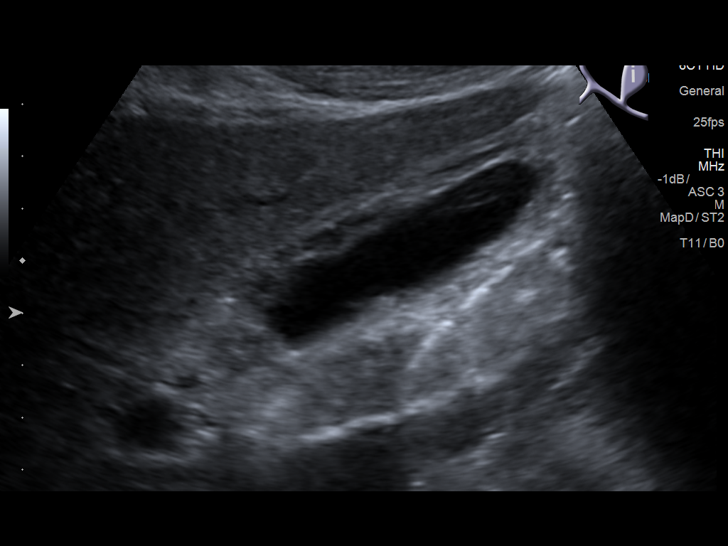
[im 8/43]
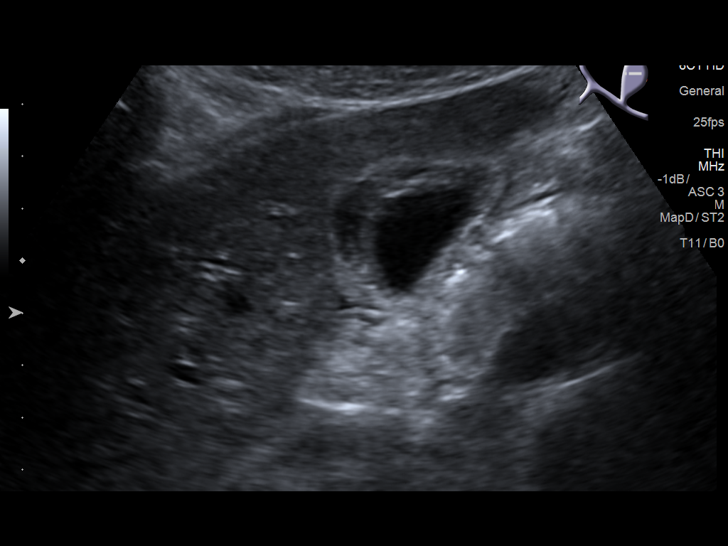
[im 11/43]
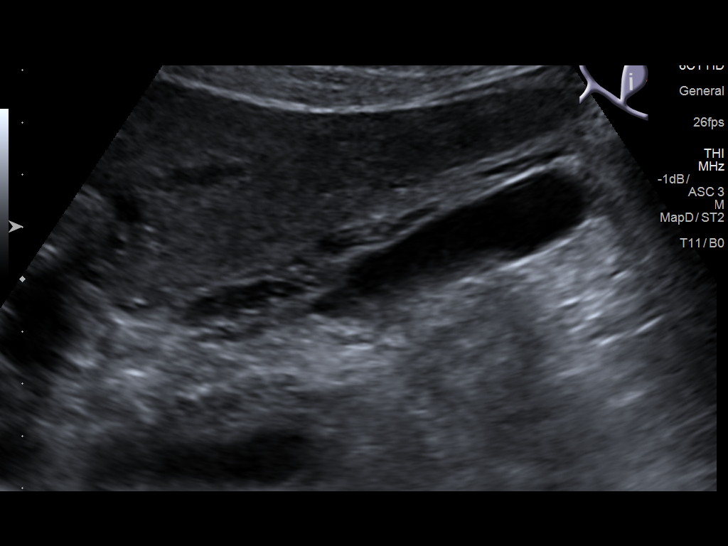
[im 15/43]
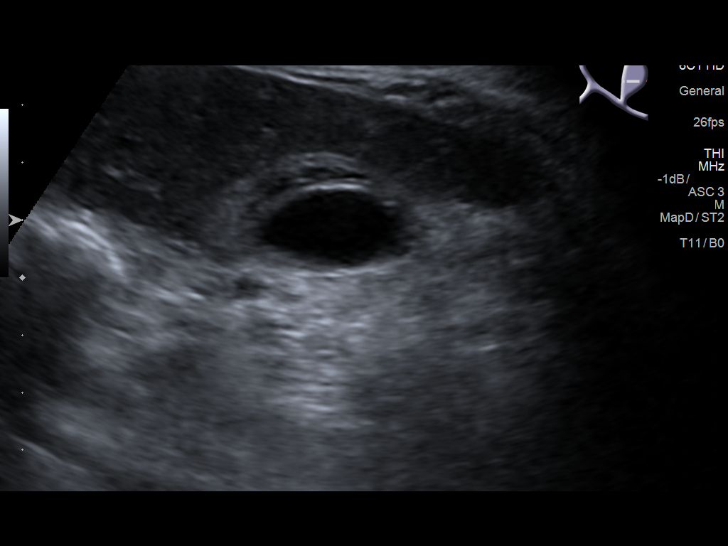
[im 16/43]
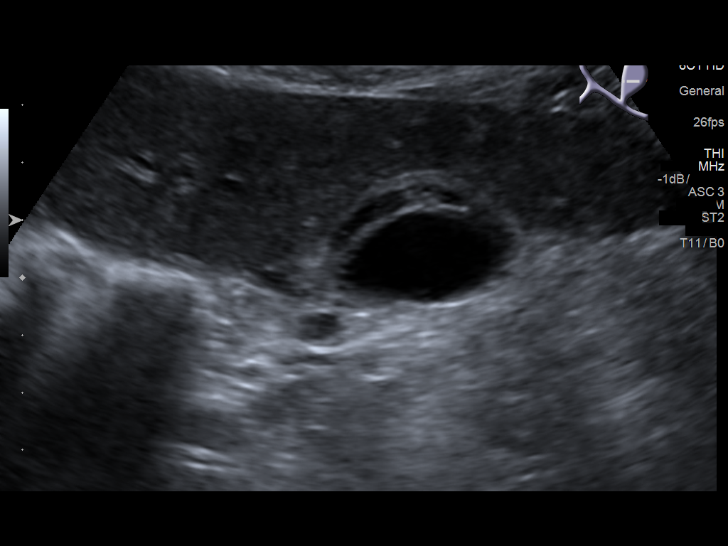
[im 20/43]
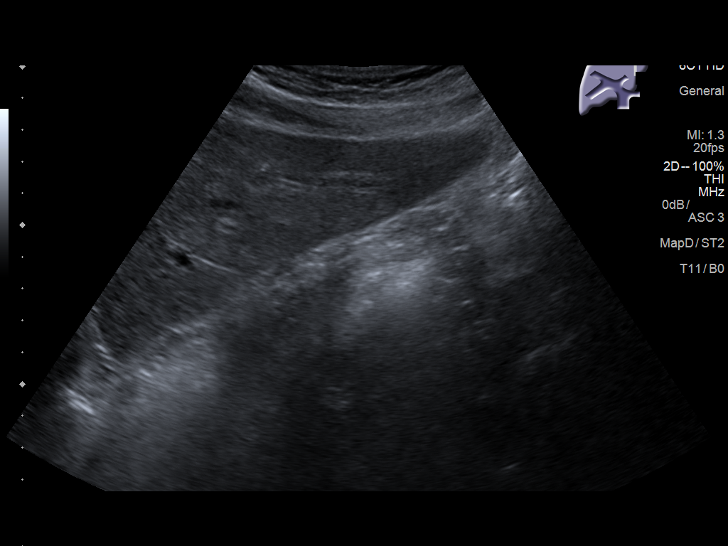
[im 23/43]
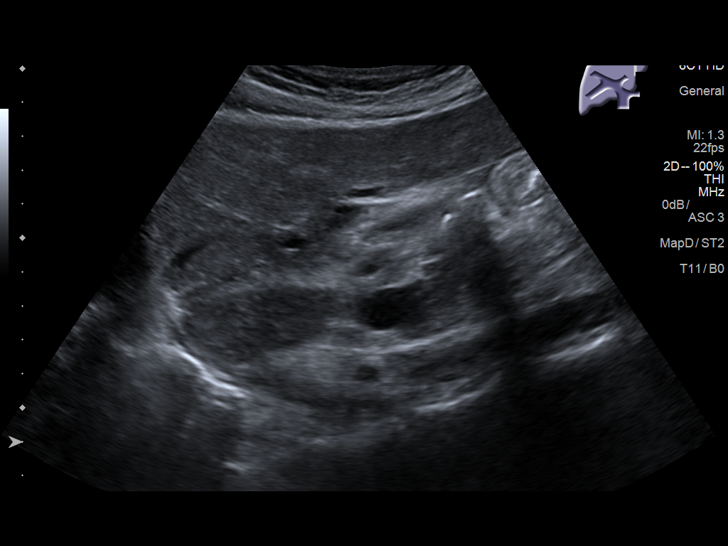
[im 27/43]
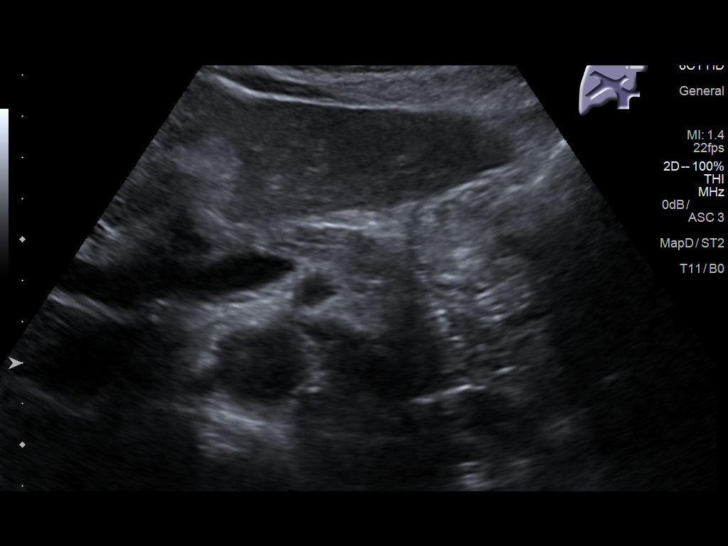
[im 29/43]
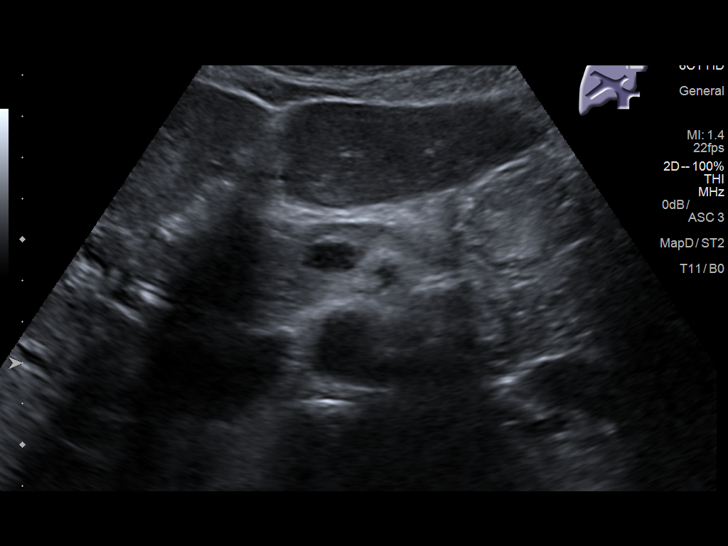
[im 32/43]
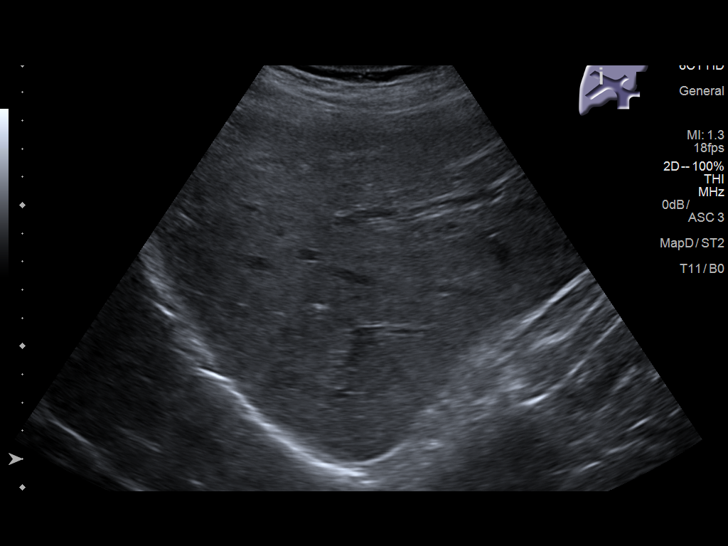
[im 36/43]
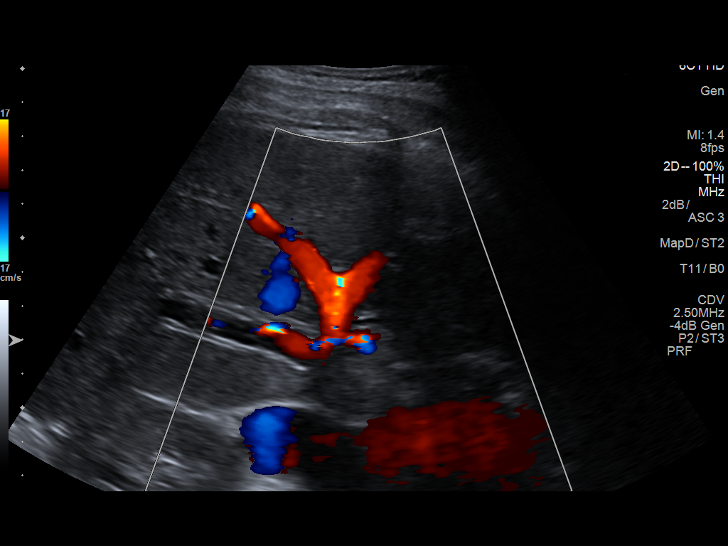
[im 39/43]
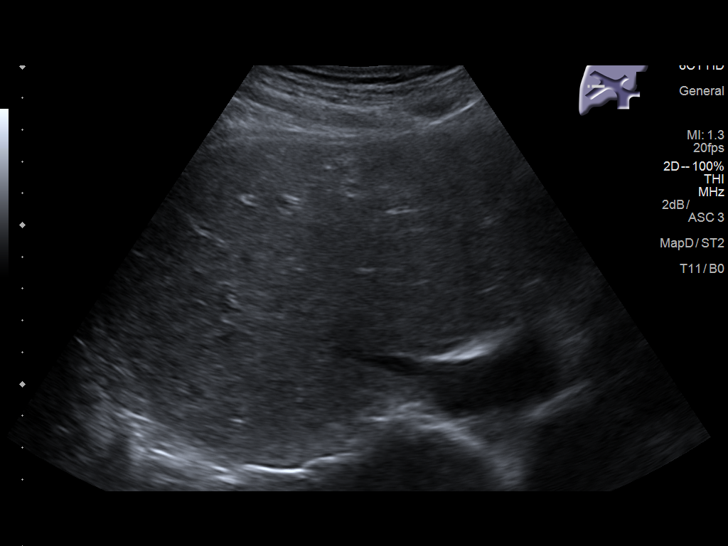
[im 43/43]
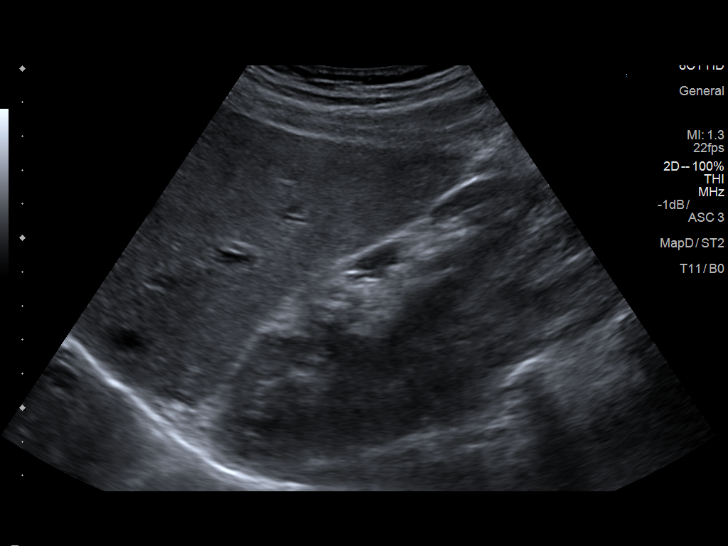

[14 of 25 positions shown; findings below may reference images not displayed]

FINDINGS: Gallbladder:

Markedly thickened gallbladder wall. Minimal pericholecystic fluid.
Edema within gallbladder wall. Unable to accurately assess for
sonographic Murphy sign due to prior morphine administration. No
definite shadowing gallstones identified.

Common bile duct:

Diameter: 3 mm diameter, normal

Liver:

Normal parenchymal echogenicity. No focal mass lesion or nodularity.
Minimal intrahepatic biliary dilatation appears to be present though
no extrahepatic biliary dilatation is seen. Portal vein is patent on
color Doppler imaging with normal direction of blood flow towards
the liver.

No additional RIGHT upper quadrant ascites.
IMPRESSION: Markedly thickened gallbladder wall with pericholecystic fluid.

No definite gallstones identified; unable to assess for sonographic
Murphy sign due to narcotic administration.

Findings suspicious for acute cholecystitis.

Normal caliber CBD though there appears to be minimal intrahepatic
biliary dilatation, recommend correlation with LFTs.

## 2019-12-21 ENCOUNTER — Ambulatory Visit: Payer: BC Managed Care – PPO | Attending: Internal Medicine

## 2019-12-21 DIAGNOSIS — Z23 Encounter for immunization: Secondary | ICD-10-CM

## 2019-12-21 NOTE — Progress Notes (Signed)
   Covid-19 Vaccination Clinic  Name:  Deborah Huff    MRN: [id number] DOB: 03-Dec-1985  12/21/2019  [name] was observed post Covid-19 immunization for 15 minutes without incidence. She was provided with Vaccine Information Sheet and instruction to access the V-Safe system.   [name] was instructed to call 911 with any severe reactions post vaccine: Marland Kitchen Difficulty breathing  . Swelling of your face and throat  . A fast heartbeat  . A bad rash all over your body  . Dizziness and weakness    Immunizations Administered    Name Date Dose VIS Date Route   Pfizer COVID-19 Vaccine 12/21/2019  2:53 PM 0.3 mL 10/04/2019 Intramuscular   Manufacturer: [employer], [employer]   Lot: [id number]   NDC: [address]-4

## 2020-01-11 ENCOUNTER — Ambulatory Visit: Payer: BC Managed Care – PPO | Attending: Internal Medicine

## 2020-01-11 DIAGNOSIS — Z23 Encounter for immunization: Secondary | ICD-10-CM

## 2020-01-11 NOTE — Progress Notes (Signed)
   Covid-19 Vaccination Clinic  Name:  Deborah Huff    MRN: [id number] DOB: 10-31-1985  01/11/2020  [name] was observed post Covid-19 immunization for 15 minutes without incident. She was provided with Vaccine Information Sheet and instruction to access the V-Safe system.   [name] was instructed to call 911 with any severe reactions post vaccine: Marland Kitchen Difficulty breathing  . Swelling of face and throat  . A fast heartbeat  . A bad rash all over body  . Dizziness and weakness   Immunizations Administered    Name Date Dose VIS Date Route   Pfizer COVID-19 Vaccine 01/11/2020  3:01 PM 0.3 mL 10/04/2019 Intramuscular   Manufacturer: [employer], [employer]   Lot: [id number]   NDC: [address]-4

## 2020-12-29 ENCOUNTER — Other Ambulatory Visit: Payer: Self-pay

## 2020-12-29 ENCOUNTER — Encounter: Payer: Self-pay | Admitting: Gastroenterology

## 2020-12-29 ENCOUNTER — Ambulatory Visit (INDEPENDENT_AMBULATORY_CARE_PROVIDER_SITE_OTHER): Payer: BC Managed Care – PPO | Admitting: Gastroenterology

## 2020-12-29 VITALS — BP 104/60 | HR 76 | Ht 65.5 in | Wt 155.0 lb

## 2020-12-29 DIAGNOSIS — R197 Diarrhea, unspecified: Secondary | ICD-10-CM

## 2020-12-29 MED ORDER — COLESTIPOL HCL 1 G PO TABS: ORAL_TABLET | ORAL | 1 refills | Status: DC

## 2020-12-29 NOTE — Patient Instructions (Addendum)
It was a pleasure to meet you today. Based on our discussion, I am providing you with my recommendations below:  RECOMMENDATION(S):   I am recommending lab work to better evaluate your symptoms and would like for you to avoid carbonated beverages, sugar substitutes and dairy. Finally, I would like for you to try Colestipol as written below.  LABS:   . Please proceed to the basement level for lab work before leaving today. Press "B" on the elevator. The lab is located at the first door on the left as you exit the elevator.  HEALTHCARE LAWS AND MY CHART RESULTS:   . Due to recent changes in healthcare laws, you may see results of your imaging and/or laboratory studies on MyChart before I have had a chance to review them.  I understand that in some cases there may be results that are confusing or concerning to you. Please understand that not all results are received at same time and often I may need to interpret multiple results in order to provide you with the best plan of care or course of treatment. Therefore, I ask that you please give me 48 hours to thoroughly review all your results before contacting my office for clarification.   PRESCRIPTION MEDICATION(S):   We have sent the following medication(s) to your pharmacy:  . Colestipol - please take 1g by mouth every morning for 7 days, then increase to 1g by mouth 2 times daily.  NOTE: If your medication(s) requires a PRIOR AUTHORIZATION, we will receive notification from your pharmacy. Once received, the process to submit for approval may take up to 7-10 business days. You will be contacted about any denials we have received from your insurance company as well as alternatives recommended by your provider.  BMI:  . If you are age 65 or younger, your body mass index should be between 19-25. Your There is no height or weight on file to calculate BMI. If this is out of the aformentioned range listed, please consider follow up with your Primary  Care Provider.   Thank you for trusting me with your gastrointestinal care!    Tressia Danas, MD, MPH

## 2020-12-29 NOTE — Progress Notes (Signed)
Referring Provider: Griselda Miner, MD Primary Care Physician:  Waynard Reeds, MD  Reason for Consultation:  Diarrhea after cholecystectomy   IMPRESSION:  Chronic diarrhea occurring post-cholecystectomy, but worse over the last year   Suspected bile acid diarrhea. However, the differential diagnosis of chronic diarrhea without alarm features includes: irritable bowel syndrome, IBD, celiac disease, missed infection (such as giardia), food intolerance, microscopic colitis, thyroid disorder, other functional GI disease. By history, this is less likely to be obstruction or overflow diarrhea.  PLAN: - Avoid carbonated beverages, sugar substitutes, and dairy - Meat allergies - Fecal calprotectin, Giardia - TTGA, IgA, TSH - Trial of colestipol 1g QAM x 1 week, then increased to 1g BID - Colonoscopy if labs nondiagnostic and symptoms not improved on colestipol - Follow-up in 4-6 weeks, earlier if needed   Please see the "Patient Instructions" section for addition details about the plan.  HPI: Deborah Huff is a 35 y.o. female referred by Dr. Carolynne Edouard for post-cholecystectomy diarrhea. She is an Geophysicist/field seismologist principal. Diagnosed with IBS during college after a diagnosis and treatment with H pylori when she continued to have constipation and diarrhea.   Diarrhea occurred after cholecystectomy for chronic cholecystitis 07/27/18 but has been worse over the last year. Has liquid stools after eating. Diarrhea triggered by read meat, processed foods, sugars. Intermittent mucous in the stool. No blood in the stool.  Associated bloating. Has two bowel movements daily.   Weight fluctuates. Appetite is good. No nocturnal symptoms. Initially thought it was stress induced, but, she doesn't see a correlation any more.   No known family history of colon cancer or polyps. No family history of uterine/endometrial cancer, pancreatic cancer or gastric/stomach cancer.   Past Medical History:  Diagnosis Date  .  Anemia     Past Surgical History:  Procedure Laterality Date  . CHOLECYSTECTOMY N/A 07/27/2018   Procedure: LAPAROSCOPIC CHOLECYSTECTOMY WITH INTRAOPERATIVE CHOLANGIOGRAM ERAS PATHWAY;  Surgeon: Griselda Miner, MD;  Location: [facility] OR;  Service: General;  Laterality: N/A;  . NO PAST SURGERIES      Current Outpatient Medications  Medication Sig Dispense Refill  . cetirizine (ZYRTEC ALLERGY) 10 MG tablet Take 1 tablet (10 mg total) at bedtime by mouth. (Patient taking differently: Take 10 mg by mouth daily as needed for allergies.) 30 tablet 1  . colestipol (COLESTID) 1 g tablet Please take 1g by mouth x7 days, then take 1g by mouth BID 60 tablet 1  . ibuprofen (ADVIL,MOTRIN) 200 MG tablet Take 200 mg by mouth 2 (two) times daily as needed for headache or moderate pain.    . NORLYDA 0.35 MG tablet Take 0.35 mg by mouth daily.  2   No current facility-administered medications for this visit.    Allergies as of 12/29/2020  . (No Known Allergies)    Family History  Problem Relation Age of Onset  . Hypertension Mother   . Hypertension Father   . Cancer Maternal Grandmother   . Heart disease Maternal Grandmother   . Diabetes Maternal Grandmother   . Cancer Maternal Grandfather   . Heart disease Maternal Grandfather      Review of Systems: 12 system ROS is negative except as noted above with the addition of allergy, fatigue, headaches, sore throat.   Physical Exam: General:   Alert,  well-nourished, pleasant and cooperative in NAD Head:  Normocephalic and atraumatic. Eyes:  Sclera clear, no icterus.   Conjunctiva pink. Ears:  Normal auditory acuity. Nose:  No deformity, discharge,  or lesions. Mouth:  No deformity or lesions.   Neck:  Supple; no masses or thyromegaly. Lungs:  Clear throughout to auscultation.   No wheezes. Heart:  Regular rate and rhythm; no murmurs. Abdomen:  Soft, nontender, nondistended, normal bowel sounds, no rebound or guarding. No hepatosplenomegaly.    Rectal:  Deferred  Msk:  Symmetrical. No boney deformities LAD: No inguinal or umbilical LAD Extremities:  No clubbing or edema. Neurologic:  Alert and  oriented x4;  grossly nonfocal Skin:  Intact without significant lesions or rashes. Psych:  Alert and cooperative. Normal mood and affect.   Kimberly L. Orvan Falconer, MD, MPH 12/29/2020, 9:28 AM

## 2020-12-30 LAB — IGA: Immunoglobulin A: 114 mg/dL (ref 47–310)

## 2020-12-30 LAB — TISSUE TRANSGLUTAMINASE, IGA: (tTG) Ab, IgA: <1 U/mL

## 2021-01-01 LAB — ALLERGEN PROFILE, FOOD-MEAT
Beef IgE: <0.1 kU/L
Chicken IgE: <0.1 kU/L
Pork IgE: <0.1 kU/L

## 2021-01-04 ENCOUNTER — Other Ambulatory Visit: Payer: Self-pay

## 2021-01-04 DIAGNOSIS — R197 Diarrhea, unspecified: Secondary | ICD-10-CM

## 2021-01-05 LAB — GIARDIA ANTIGEN
MICRO NUMBER:: 11643411
RESULT:: NOT DETECTED
SPECIMEN QUALITY:: ADEQUATE

## 2021-01-07 LAB — CALPROTECTIN, FECAL: Calprotectin, Fecal: 26 ug/g (ref 0–120)

## 2021-02-23 ENCOUNTER — Ambulatory Visit: Payer: Self-pay | Admitting: Gastroenterology

## 2021-02-25 ENCOUNTER — Other Ambulatory Visit: Payer: Self-pay

## 2021-02-25 ENCOUNTER — Telehealth: Payer: Self-pay | Admitting: Gastroenterology

## 2021-02-25 DIAGNOSIS — R197 Diarrhea, unspecified: Secondary | ICD-10-CM

## 2021-02-25 MED ORDER — COLESTIPOL HCL 1 G PO TABS: 1.0000 g | ORAL_TABLET | Freq: Two times a day (BID) | ORAL | 0 refills | Status: DC

## 2021-02-25 NOTE — Telephone Encounter (Signed)
Outpatient Medication Detail   Disp Refills Start End   colestipol (COLESTID) 1 g tablet 60 tablet 0 02/25/2021    Sig - Route: Take 1 tablet (1 g total) by mouth 2 (two) times daily. MUST KEEP NEXT FOLLOW UP APPT - Oral   Sent to pharmacy as: colestipol (COLESTID) 1 g tablet   Notes to Pharmacy: FUTURE REFILL REQUESTS REQUIRE AN APPT   E-Prescribing Status: Receipt confirmed by pharmacy (02/25/2021 10:23 AM EDT)

## 2021-03-26 ENCOUNTER — Other Ambulatory Visit: Payer: Self-pay | Admitting: Gastroenterology

## 2021-03-26 DIAGNOSIS — R197 Diarrhea, unspecified: Secondary | ICD-10-CM

## 2021-04-21 ENCOUNTER — Ambulatory Visit: Payer: Self-pay | Admitting: Gastroenterology

## 2021-04-24 ENCOUNTER — Other Ambulatory Visit: Payer: Self-pay | Admitting: Gastroenterology

## 2021-04-24 DIAGNOSIS — R197 Diarrhea, unspecified: Secondary | ICD-10-CM

## 2021-05-18 ENCOUNTER — Ambulatory Visit (INDEPENDENT_AMBULATORY_CARE_PROVIDER_SITE_OTHER): Payer: Self-pay | Admitting: Gastroenterology

## 2021-05-18 ENCOUNTER — Encounter: Payer: Self-pay | Admitting: Gastroenterology

## 2021-05-18 VITALS — BP 119/67 | HR 77 | Ht 65.0 in | Wt 162.0 lb

## 2021-05-18 DIAGNOSIS — R197 Diarrhea, unspecified: Secondary | ICD-10-CM

## 2021-05-18 NOTE — Progress Notes (Signed)
Referring Provider: Waynard Reeds, MD Primary Care Physician:  Waynard Reeds, MD  Chief complaint:  Diarrhea after cholecystectomy   IMPRESSION:  Bile acid diarrhea occurring post-cholecystectomy, improved on colestipol Maternal grandmother with colon cancer in her 24s No other known family history of colon cancer or polyps   PLAN: - Continue colestipol 1g BID - Colonoscopy if labs nondiagnostic and symptoms not improved on colestipol - Follow-up in 6 months, earlier if needed   Please see the "Patient Instructions" section for addition details about the plan.  HPI: Deborah Huff is a 35 y.o. female initially referred by Dr. Carolynne Edouard for post-cholecystectomy diarrhea. She is an Geophysicist/field seismologist principal. Diagnosed with IBS during college after a diagnosis and treatment with H pylori when she continued to have constipation and diarrhea.   Initially developed diarrhea after cholecystectomy for chronic cholecystitis 07/27/18. Had been worse over the last year. Has liquid stools after eating. Diarrhea triggered by read meat, processed foods, sugars. Intermittent mucous in the stool. No blood in the stool.  Associated bloating. Has two bowel movements daily.    Initial evaluation included normal/negative TTGA/IgA, meat allergen testing, stool for giardia, and fecal calprotectin.   She returns today in scheduled follow-up.  Diarrhea has improved on colestipol 1 g BID. Now having a bowel movement every 2-3 days. Some worsening constipation if she eats bread. Symptoms have normalized enough that she is now able to eat in restaurants again.   Maternal grandmother with colon cancer in her 45s. No other known family history of colon cancer or polyps. No family history of uterine/endometrial cancer, pancreatic cancer or gastric/stomach cancer.   Past Medical History:  Diagnosis Date   Anemia     Past Surgical History:  Procedure Laterality Date   CHOLECYSTECTOMY N/A 07/27/2018   Procedure:  LAPAROSCOPIC CHOLECYSTECTOMY WITH INTRAOPERATIVE CHOLANGIOGRAM ERAS PATHWAY;  Surgeon: Griselda Miner, MD;  Location: [facility] OR;  Service: General;  Laterality: N/A;   NO PAST SURGERIES      Current Outpatient Medications  Medication Sig Dispense Refill   cetirizine (ZYRTEC ALLERGY) 10 MG tablet Take 1 tablet (10 mg total) at bedtime by mouth. (Patient taking differently: Take 10 mg by mouth daily as needed for allergies.) 30 tablet 1   colestipol (COLESTID) 1 g tablet TAKE 1 TABLET(1 GRAM) BY MOUTH TWICE DAILY 60 tablet 0   ibuprofen (ADVIL,MOTRIN) 200 MG tablet Take 200 mg by mouth 2 (two) times daily as needed for headache or moderate pain.     NORLYDA 0.35 MG tablet Take 0.35 mg by mouth daily.  2   No current facility-administered medications for this visit.    Allergies as of 05/18/2021   (No Known Allergies)    Family History  Problem Relation Age of Onset   Hypertension Mother    Hypertension Father    Colon cancer Maternal Grandmother    Cancer Maternal Grandmother    Heart disease Maternal Grandmother    Diabetes Maternal Grandmother    Cancer Maternal Grandfather    Heart disease Maternal Grandfather      Physical Exam: General:   Alert,  well-nourished, pleasant and cooperative in NAD Head:  Normocephalic and atraumatic. Eyes:  Sclera clear, no icterus.   Conjunctiva pink. Abdomen:  Soft, nontender, nondistended, normal bowel sounds, no rebound or guarding. No hepatosplenomegaly.   Neurologic:  Alert and  oriented x4;  grossly nonfocal Skin:  Intact without significant lesions or rashes. Psych:  Alert and cooperative. Normal mood and affect.   Cala Bradford  Oralia Manis, MD, MPH 05/18/2021, 4:51 PM

## 2021-05-18 NOTE — Patient Instructions (Addendum)
It was my pleasure to provide care to you today. Based on our discussion, I am providing you with my recommendations below:  FOLLOW UP:  I would like for you to follow up with me in 6 months but if symptoms have improved, may follow up annually. Please call the office at [phone] to schedule your appointment.  BMI:  If you are age 33 or younger, your body mass index should be between 19-25. Your There is no height or weight on file to calculate BMI. If this is out of the aformentioned range listed, please consider follow up with your Primary Care Provider.   MY CHART:  The [place] [facility] providers would like to encourage you to use [facility] to communicate with providers for non-urgent requests or questions.  Due to long hold times on the telephone, sending your provider a message by [facility] may be a faster and more efficient way to get a response.  Please allow 48 business hours for a response.  Please remember that this is for non-urgent requests.   Thank you for trusting me with your gastrointestinal care!    Tressia Danas, MD, MPH

## 2021-05-25 ENCOUNTER — Other Ambulatory Visit: Payer: Self-pay | Admitting: Gastroenterology

## 2021-05-25 DIAGNOSIS — R197 Diarrhea, unspecified: Secondary | ICD-10-CM

## 2021-06-24 ENCOUNTER — Other Ambulatory Visit: Payer: Self-pay | Admitting: Gastroenterology

## 2021-06-24 DIAGNOSIS — R197 Diarrhea, unspecified: Secondary | ICD-10-CM

## 2021-09-24 ENCOUNTER — Other Ambulatory Visit: Payer: Self-pay | Admitting: Gastroenterology

## 2021-09-24 DIAGNOSIS — R197 Diarrhea, unspecified: Secondary | ICD-10-CM

## 2021-12-18 ENCOUNTER — Other Ambulatory Visit: Payer: Self-pay | Admitting: Gastroenterology

## 2021-12-18 DIAGNOSIS — R197 Diarrhea, unspecified: Secondary | ICD-10-CM

## 2023-01-16 ENCOUNTER — Other Ambulatory Visit: Payer: Self-pay | Admitting: Gastroenterology

## 2023-01-16 ENCOUNTER — Telehealth: Payer: Self-pay | Admitting: Gastroenterology

## 2023-01-16 DIAGNOSIS — R197 Diarrhea, unspecified: Secondary | ICD-10-CM

## 2023-01-16 MED ORDER — COLESTIPOL HCL 1 G PO TABS: ORAL_TABLET | ORAL | 1 refills | Status: DC

## 2023-01-16 NOTE — Telephone Encounter (Signed)
Inbound call from patient requesting a refill for colestipol .Please advise

## 2023-01-16 NOTE — Telephone Encounter (Signed)
Returned call to patient. Informed patient that she needs to schedule follow up appointment. Appt scheduled for 02/27/23 with Nevin Bloodgood.-NP .Refill for Colestipol has been sent to pharmacy. Patient understands that she needs to keep follow-up appt in order to receive further refills.

## 2023-02-27 ENCOUNTER — Encounter: Payer: Self-pay | Admitting: Nurse Practitioner

## 2023-02-27 ENCOUNTER — Ambulatory Visit: Payer: BC Managed Care – PPO | Admitting: Nurse Practitioner

## 2023-02-27 VITALS — BP 126/72 | HR 74 | Ht 65.0 in | Wt 180.4 lb

## 2023-02-27 DIAGNOSIS — K9089 Other intestinal malabsorption: Secondary | ICD-10-CM

## 2023-02-27 DIAGNOSIS — R197 Diarrhea, unspecified: Secondary | ICD-10-CM

## 2023-02-27 MED ORDER — COLESTIPOL HCL 1 G PO TABS: 1.0000 g | ORAL_TABLET | Freq: Every day | ORAL | 11 refills | Status: DC

## 2023-02-27 NOTE — Progress Notes (Signed)
Agree with assessment/plan.  Raj Norena Bratton, MD [facility] [phone]  

## 2023-02-27 NOTE — Progress Notes (Signed)
Assessment and Plan   Primary Gi: Deborah Danas, MD  Brief Narrative:  37 y.o. 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   Previously evaluated by Dr. Orvan Falconer -Will refill colestipol 1 g daily, #30 with refills for 1 year.  -Follow-up in one, sooner if needed   History of Present Illness   Chief complaint: Needs refill on diarrhea medication  Patient known to Dr. Orvan Falconer, last seen July 2022.  She has a history of bile salt diarrhea occurring postcholecystectomy.  Symptoms managed well with colestipol as long as she avoids red meat. Also has to limit sugar intake.  About a year ago she was able to reduce the dose of colestipol to 1 gram daily    She called the end of March for refill on colestipol.  She was given a refill but asked to make a follow-up appointment for future refills.  She has no upper or lower GI issues today   Previous GI Endoscopies / Labs / Imaging       Latest Ref Rng & Units 07/03/2018    8:50 PM  Hepatic Function  Total Protein 6.5 - 8.1 g/dL 7.7   Albumin 3.5 - 5.0 g/dL 4.4   AST 15 - 41 U/L 914   ALT 0 - 44 U/L 87   Alk Phosphatase 38 - 126 U/L 59   Total Bilirubin 0.3 - 1.2 mg/dL 0.8        Latest Ref Rng & Units 07/23/2018    3:42 PM 07/03/2018    8:50 PM 11/17/2017    5:29 AM  CBC  WBC 4.0 - 10.5 K/uL 3.6  9.6  10.0   Hemoglobin 12.0 - 15.0 g/dL 78.2  95.6  9.7   Hematocrit 36.0 - 46.0 % 37.4  35.9  28.6   Platelets 150 - 400 K/uL 248  221  215    normal/negative TTGA/IgA, meat allergen testing, stool for giardia, and fecal calprotectin.     Past Medical History:  Diagnosis Date   Anemia     Past Surgical History:  Procedure Laterality Date   CHOLECYSTECTOMY N/A 07/27/2018   Procedure: LAPAROSCOPIC CHOLECYSTECTOMY WITH INTRAOPERATIVE  CHOLANGIOGRAM ERAS PATHWAY;  Surgeon: Griselda Miner, MD;  Location: [facility] OR;  Service: General;  Laterality: N/A;   NO PAST SURGERIES      Current Medications, Allergies, Family History and Social History were reviewed in [employer] medical record.     Current Outpatient Medications  Medication Sig Dispense Refill   colestipol (COLESTID) 1 g tablet TAKE 1 TABLET(1 GRAM) BY MOUTH TWICE DAILY 60 tablet 1   cetirizine (ZYRTEC ALLERGY) 10 MG tablet Take 1 tablet (10 mg total) at bedtime by mouth. (Patient not taking: Reported on 02/27/2023) 30 tablet 1   ibuprofen (ADVIL,MOTRIN) 200 MG tablet Take 200 mg by mouth 2 (two) times daily as needed for headache or moderate pain. (Patient not taking: Reported on 02/27/2023)     NORLYDA 0.35 MG tablet Take 0.35 mg by mouth daily. (Patient not taking: Reported on 02/27/2023)  2   No current facility-administered medications for this visit.    Review of Systems: No chest pain. No shortness of breath. No urinary complaints.    Physical Exam  Wt Readings from Last 3 Encounters:  02/27/23 180 lb 6.4 oz (81.8 kg)  05/18/21  162 lb (73.5 kg)  12/29/20 155 lb (70.3 kg)    BP 126/72   Pulse 74   Ht 5\' 5"  (1.651 m)   Wt 180 lb 6.4 oz (81.8 kg)   SpO2 99%   BMI 30.02 kg/m  Constitutional:  Pleasant, generally well appearing female in no acute distress. Psychiatric: Normal mood and affect. Behavior is normal. EENT: Pupils normal.  Conjunctivae are normal. No scleral icterus. Neck supple.  Cardiovascular: Normal rate, regular rhythm.  Pulmonary/chest: Effort normal and breath sounds normal. No wheezing, rales or rhonchi. Abdominal: Soft, nondistended, nontender. Bowel sounds active throughout. There are no masses palpable. No hepatomegaly. Neurological: Alert and oriented to person place and time.   Skin: Skin is warm and dry. No rashes noted.  Deborah Cluster, Deborah Huff  02/27/2023, 11:09 AM

## 2023-02-27 NOTE — Patient Instructions (Addendum)
  We have sent the following medications to your pharmacy for you to pick up at your convenience: Colestid  _______________________________________________________  If your blood pressure at your visit was 140/90 or greater, please contact your primary care physician to follow up on this.  _______________________________________________________  If you are age 35 or older, your body mass index should be between 23-30. Your Body mass index is 30.02 kg/m. If this is out of the aforementioned range listed, please consider follow up with your Primary Care Provider.  If you are age 26 or younger, your body mass index should be between 19-25. Your Body mass index is 30.02 kg/m. If this is out of the aformentioned range listed, please consider follow up with your Primary Care Provider.   ________________________________________________________  The [place] [facility] providers would like to encourage you to use [facility] to communicate with providers for non-urgent requests or questions.  Due to long hold times on the telephone, sending your provider a message by [facility] may be a faster and more efficient way to get a response.  Please allow 48 business hours for a response.  Please remember that this is for non-urgent requests.  _______________________________________________________ It was a pleasure to see you today!  Thank you for trusting me with your gastrointestinal care!

## 2023-03-14 ENCOUNTER — Other Ambulatory Visit: Payer: Self-pay | Admitting: Gastroenterology

## 2023-03-14 DIAGNOSIS — R197 Diarrhea, unspecified: Secondary | ICD-10-CM

## 2024-01-25 ENCOUNTER — Other Ambulatory Visit: Payer: Self-pay | Admitting: Nurse Practitioner

## 2024-01-25 DIAGNOSIS — R197 Diarrhea, unspecified: Secondary | ICD-10-CM

## 2024-05-12 ENCOUNTER — Other Ambulatory Visit: Payer: Self-pay | Admitting: Nurse Practitioner

## 2024-05-12 DIAGNOSIS — R197 Diarrhea, unspecified: Secondary | ICD-10-CM

## 2024-06-20 ENCOUNTER — Other Ambulatory Visit: Payer: Self-pay | Admitting: Nurse Practitioner

## 2024-06-20 DIAGNOSIS — R197 Diarrhea, unspecified: Secondary | ICD-10-CM

## 2024-08-13 ENCOUNTER — Ambulatory Visit: Admitting: Gastroenterology

## 2024-08-13 ENCOUNTER — Encounter: Payer: Self-pay | Admitting: Gastroenterology

## 2024-08-13 VITALS — BP 106/70 | HR 74 | Ht 65.0 in | Wt 184.5 lb

## 2024-08-13 DIAGNOSIS — K529 Noninfective gastroenteritis and colitis, unspecified: Secondary | ICD-10-CM | POA: Diagnosis not present

## 2024-08-13 DIAGNOSIS — Z9049 Acquired absence of other specified parts of digestive tract: Secondary | ICD-10-CM | POA: Diagnosis not present

## 2024-08-13 DIAGNOSIS — R197 Diarrhea, unspecified: Secondary | ICD-10-CM

## 2024-08-13 DIAGNOSIS — K9089 Other intestinal malabsorption: Secondary | ICD-10-CM

## 2024-08-13 MED ORDER — COLESTIPOL HCL 1 G PO TABS: 1.0000 g | ORAL_TABLET | Freq: Two times a day (BID) | ORAL | 11 refills | Status: AC

## 2024-08-13 NOTE — Patient Instructions (Signed)
 _______________________________________________________  If your blood pressure at your visit was 140/90 or greater, please contact your primary care physician to follow up on this.  _______________________________________________________  If you are age 36 or older, your body mass index should be between 23-30. Your Body mass index is 30.7 kg/m. If this is out of the aforementioned range listed, please consider follow up with your Primary Care Provider.  If you are age 23 or younger, your body mass index should be between 19-25. Your Body mass index is 30.7 kg/m. If this is out of the aformentioned range listed, please consider follow up with your Primary Care Provider.   ________________________________________________________  The [place] [facility] providers would like to encourage you to use [facility] to communicate with providers for non-urgent requests or questions.  Due to long hold times on the telephone, sending your provider a message by [facility] may be a faster and more efficient way to get a response.  Please allow 48 business hours for a response.  Please remember that this is for non-urgent requests.  _______________________________________________________  [facility] is using a team-based approach to care.  Your team is made up of your doctor and two to three APPS. Our APPS (Nurse Practitioners and Physician Assistants) work with your physician to ensure care continuity for you. They are fully qualified to address your health concerns and develop a treatment plan. They communicate directly with your gastroenterologist to care for you. Seeing the Advanced Practice Practitioners on your physician's team can help you by facilitating care more promptly, often allowing for earlier appointments, access to diagnostic testing, procedures, and other specialty referrals.

## 2024-08-13 NOTE — Progress Notes (Signed)
 Agree with assessment / plan as outlined.

## 2024-08-13 NOTE — Progress Notes (Signed)
 Chief Complaint: Medication refill Primary GI MD: Dr. Eda  HPI: 38 year old female history of cholecystectomy presents for medication refill  Last seen 02/2023 by Vina Dasen, NP.  Previously known to Dr. Eda.  She has a history of bile salt diarrhea occurring postcholecystectomy.  Symptoms have been well-managed with colestipol   Remote previous elevation in LFTs which have reportedly normalized through repeat draws with primary care   Past Medical History:  Diagnosis Date   Anemia     Past Surgical History:  Procedure Laterality Date   CHOLECYSTECTOMY N/A 07/27/2018   Procedure: LAPAROSCOPIC CHOLECYSTECTOMY WITH INTRAOPERATIVE CHOLANGIOGRAM ERAS PATHWAY;  Surgeon: Curvin Deward MOULD, MD;  Location: [facility] OR;  Service: General;  Laterality: N/A;   NO PAST SURGERIES      Current Outpatient Medications  Medication Sig Dispense Refill   ibuprofen  (ADVIL ,MOTRIN ) 200 MG tablet Take 200 mg by mouth 2 (two) times daily as needed for headache or moderate pain.     cetirizine  (ZYRTEC  ALLERGY) 10 MG tablet Take 1 tablet (10 mg total) at bedtime by mouth. (Patient not taking: Reported on 08/13/2024) 30 tablet 1   colestipol  (COLESTID ) 1 g tablet Take 1 tablet (1 g total) by mouth 2 (two) times daily. Please schedule a yearly follow up for further refills. Thank you 60 tablet 11   No current facility-administered medications for this visit.    Allergies as of 08/13/2024   (No Known Allergies)    Family History  Problem Relation Age of Onset   Hypertension Mother    Hypertension Father    Prostate cancer Father    Colon cancer Maternal Grandmother    Cancer Maternal Grandmother    Heart disease Maternal Grandmother    Diabetes Maternal Grandmother    Cancer Maternal Grandfather    Heart disease Maternal Grandfather    Esophageal cancer Neg Hx    Stomach cancer Neg Hx     Social History   Socioeconomic History   Marital status: Married    Spouse name: Not on file    Number of children: Not on file   Years of education: Not on file   Highest education level: Not on file  Occupational History   Not on file  Tobacco Use   Smoking status: Never   Smokeless tobacco: Never  Vaping Use   Vaping status: Never Used  Substance and Sexual Activity   Alcohol use: No    Alcohol/week: 1.0 standard drink of alcohol    Types: 1 Standard drinks or equivalent per week    Comment: occassional   Drug use: No   Sexual activity: Yes  Other Topics Concern   Not on file  Social History Narrative   Not on file   Social Drivers of Health   Financial Resource Strain: Not on file  Food Insecurity: Not on file  Transportation Needs: Not on file  Physical Activity: Not on file  Stress: Not on file  Social Connections: Unknown (03/07/2022)   Received from [facility]   Social Network    Social Network: Not on file  Intimate Partner Violence: Unknown (01/27/2022)   Received from [facility]   HITS    Physically Hurt: Not on file    Insult or Talk Down To: Not on file    Threaten Physical Harm: Not on file    Scream or Curse: Not on file    Review of Systems:    Constitutional: No weight loss, fever, chills, weakness or fatigue HEENT: Eyes: No change  in vision               Ears, Nose, Throat:  No change in hearing or congestion Skin: No rash or itching Cardiovascular: No chest pain, chest pressure or palpitations   Respiratory: No SOB or cough Gastrointestinal: See HPI and otherwise negative Genitourinary: No dysuria or change in urinary frequency Neurological: No headache, dizziness or syncope Musculoskeletal: No new muscle or joint pain Hematologic: No bleeding or bruising Psychiatric: No history of depression or anxiety    Physical Exam:  Vital signs: BP 106/70 (BP Location: Left Arm, Patient Position: Sitting, Cuff Size: Normal)   Pulse 74   Ht 5' 5 (1.651 m)   Wt 184 lb 8 oz (83.7 kg)   BMI 30.70 kg/m   Constitutional: NAD, alert and  cooperative Head:  Normocephalic and atraumatic. Eyes:   PEERL, EOMI. No icterus. Conjunctiva pink. Respiratory: Respirations even and unlabored. Lungs clear to auscultation bilaterally.   No wheezes, crackles, or rhonchi.  Cardiovascular:  Regular rate and rhythm. No peripheral edema, cyanosis or pallor.  Gastrointestinal:  Soft, nondistended, nontender. No rebound or guarding. Normal bowel sounds. No appreciable masses or hepatomegaly. Rectal:  Declines Msk:  Symmetrical without gross deformities. Without edema, no deformity or joint abnormality.  Neurologic:  Alert and  oriented x4;  grossly normal neurologically.  Skin:   Dry and intact without significant lesions or rashes. Psychiatric: Oriented to person, place and time. Demonstrates good judgement and reason without abnormal affect or behaviors.  Physical Exam    RELEVANT LABS AND IMAGING: CBC    Component Value Date/Time   WBC 3.6 (L) 07/23/2018 1542   RBC 4.05 07/23/2018 1542   HGB 12.1 07/23/2018 1542   HCT 37.4 07/23/2018 1542   PLT 248 07/23/2018 1542   MCV 92.3 07/23/2018 1542   MCH 29.9 07/23/2018 1542   MCHC 32.4 07/23/2018 1542   RDW 11.7 07/23/2018 1542   LYMPHSABS 1.2 07/03/2018 2050   MONOABS 0.5 07/03/2018 2050   EOSABS 0.1 07/03/2018 2050   BASOSABS 0.0 07/03/2018 2050    CMP     Component Value Date/Time   NA 137 07/03/2018 2050   K 3.2 (L) 07/03/2018 2050   CL 102 07/03/2018 2050   CO2 23 07/03/2018 2050   GLUCOSE 104 (H) 07/03/2018 2050   BUN 9 07/03/2018 2050   CREATININE 0.94 07/03/2018 2050   CREATININE 0.88 04/06/2016 1142   CALCIUM 8.9 07/03/2018 2050   PROT 7.7 07/03/2018 2050   ALBUMIN 4.4 07/03/2018 2050   AST 147 (H) 07/03/2018 2050   ALT 87 (H) 07/03/2018 2050   ALKPHOS 59 07/03/2018 2050   BILITOT 0.8 07/03/2018 2050   GFRNONAA >60 07/03/2018 2050   GFRNONAA >89 04/06/2016 1142   GFRAA >60 07/03/2018 2050   GFRAA >89 04/06/2016 1142     Assessment/Plan:   Chronic  diarrhea felt to be bile salt related following cholecystectomy Currently well-controlled on colestipol  1 g daily.  No further issues today. - Refill colestipol  for 1 year - Follow-up in 1 year or sooner if needed  Assigned to Dr. Leigh (Tuesday)  Lesley Galentine Mollie RIGGERS [facility] 08/13/2024, 4:14 PM  Cc: Okey Leader, MD
